# Patient Record
Sex: Male | Born: 1950 | Race: Black or African American | Hispanic: No | Marital: Married | Smoking: Never smoker
Health system: Southern US, Community
[De-identification: ages and names within clinical notes are randomized; demographics above are authoritative.]

## PROBLEM LIST (undated history)

## (undated) DIAGNOSIS — E785 Hyperlipidemia, unspecified: Secondary | ICD-10-CM

## (undated) DIAGNOSIS — I10 Essential (primary) hypertension: Secondary | ICD-10-CM

## (undated) HISTORY — PX: KNEE SURGERY: SHX244

## (undated) HISTORY — PX: CARPAL TUNNEL RELEASE: SHX101

---

## 2009-08-02 ENCOUNTER — Inpatient Hospital Stay (HOSPITAL_COMMUNITY): Admission: RE | Admit: 2009-08-02 | Discharge: 2009-08-04 | Payer: Self-pay | Admitting: Orthopedic Surgery

## 2010-08-08 LAB — BASIC METABOLIC PANEL
BUN: 10 mg/dL (ref 6–23)
Chloride: 96 mEq/L (ref 96–112)
Creatinine, Ser: 1.17 mg/dL (ref 0.4–1.5)
Creatinine, Ser: 1.2 mg/dL (ref 0.4–1.5)
GFR calc Af Amer: >60 mL/min (ref >60)
GFR calc non Af Amer: >60 mL/min (ref >60)
Potassium: 4.2 mEq/L (ref 3.5–5.1)
Potassium: 4.5 mEq/L (ref 3.5–5.1)

## 2010-08-08 LAB — PROTIME-INR
INR: 0.91 (ref 0.00–1.49)
INR: 1.43 (ref 0.00–1.49)

## 2010-08-08 LAB — URINALYSIS, ROUTINE W REFLEX MICROSCOPIC
Bilirubin Urine: NEGATIVE
Hgb urine dipstick: NEGATIVE
Ketones, ur: NEGATIVE mg/dL
Protein, ur: NEGATIVE mg/dL
Specific Gravity, Urine: 1.007 (ref 1.005–1.030)
pH: 6.5 (ref 5.0–8.0)

## 2010-08-08 LAB — CBC
HCT: 31 % — ABNORMAL LOW (ref 39.0–52.0)
HCT: 31.3 % — ABNORMAL LOW (ref 39.0–52.0)
HCT: 44.2 % (ref 39.0–52.0)
Hemoglobin: 10.6 g/dL — ABNORMAL LOW (ref 13.0–17.0)
Hemoglobin: 10.7 g/dL — ABNORMAL LOW (ref 13.0–17.0)
Hemoglobin: 14.4 g/dL (ref 13.0–17.0)
MCV: 83.9 fL (ref 78.0–100.0)
MCV: 84.7 fL (ref 78.0–100.0)
Platelets: 140 10*3/uL — ABNORMAL LOW (ref 150–400)
RBC: 3.7 MIL/uL — ABNORMAL LOW (ref 4.22–5.81)
RBC: 5.22 MIL/uL (ref 4.22–5.81)
RDW: 14.2 % (ref 11.5–15.5)
WBC: 5.1 10*3/uL (ref 4.0–10.5)
WBC: 6.8 10*3/uL (ref 4.0–10.5)

## 2010-08-08 LAB — TYPE AND SCREEN: Antibody Screen: NEGATIVE

## 2010-08-08 LAB — COMPREHENSIVE METABOLIC PANEL
AST: 26 U/L (ref 0–37)
Albumin: 4.9 g/dL (ref 3.5–5.2)
Glucose, Bld: 108 mg/dL — ABNORMAL HIGH (ref 70–99)
Sodium: 135 mEq/L (ref 135–145)
Total Bilirubin: 0.9 mg/dL (ref 0.3–1.2)
Total Protein: 8.4 g/dL — ABNORMAL HIGH (ref 6.0–8.3)

## 2010-08-08 LAB — APTT: aPTT: 28 seconds (ref 24–37)

## 2012-05-02 ENCOUNTER — Observation Stay (HOSPITAL_COMMUNITY)
Admission: EM | Admit: 2012-05-02 | Discharge: 2012-05-03 | Disposition: A | Discharge status: Home / Self Care | Payer: 59 | Attending: Emergency Medicine | Admitting: Emergency Medicine

## 2012-05-02 ENCOUNTER — Emergency Department (HOSPITAL_COMMUNITY): Payer: 59

## 2012-05-02 ENCOUNTER — Encounter (HOSPITAL_COMMUNITY): Payer: Self-pay | Admitting: Emergency Medicine

## 2012-05-02 DIAGNOSIS — E785 Hyperlipidemia, unspecified: Secondary | ICD-10-CM | POA: Insufficient documentation

## 2012-05-02 DIAGNOSIS — R0789 Other chest pain: Principal | ICD-10-CM | POA: Insufficient documentation

## 2012-05-02 DIAGNOSIS — I10 Essential (primary) hypertension: Secondary | ICD-10-CM | POA: Insufficient documentation

## 2012-05-02 HISTORY — DX: Essential (primary) hypertension: I10

## 2012-05-02 HISTORY — DX: Hyperlipidemia, unspecified: E78.5

## 2012-05-02 LAB — BASIC METABOLIC PANEL
BUN: 14 mg/dL (ref 6–23)
CO2: 27 mEq/L (ref 19–32)
Creatinine, Ser: 1.12 mg/dL (ref 0.50–1.35)
GFR calc Af Amer: 80 mL/min — ABNORMAL LOW (ref >90)
GFR calc non Af Amer: 69 mL/min — ABNORMAL LOW (ref >90)

## 2012-05-02 LAB — CBC WITH DIFFERENTIAL/PLATELET
Basophils Absolute: 0 10*3/uL (ref 0.0–0.1)
Basophils Relative: 0 % (ref 0–1)
Eosinophils Absolute: 0.2 10*3/uL (ref 0.0–0.7)
Eosinophils Relative: 3 % (ref 0–5)
HCT: 41.2 % (ref 39.0–52.0)
Hemoglobin: 14.4 g/dL (ref 13.0–17.0)
Lymphocytes Relative: 22 % (ref 12–46)
MCHC: 35 g/dL (ref 30.0–36.0)
Monocytes Absolute: 0.4 10*3/uL (ref 0.1–1.0)
Monocytes Relative: 6 % (ref 3–12)
Neutrophils Relative %: 68 % (ref 43–77)

## 2012-05-02 MED ORDER — SODIUM CHLORIDE 0.9 % IV SOLN
1000.0000 mL | INTRAVENOUS | Status: DC
  Administered 2012-05-03: 1000 mL via INTRAVENOUS

## 2012-05-02 MED ORDER — ASPIRIN 81 MG PO CHEW
324.0000 mg | CHEWABLE_TABLET | Freq: Once | ORAL | Status: AC
  Administered 2012-05-02: 324 mg via ORAL
  Filled 2012-05-02: qty 4

## 2012-05-02 MED ORDER — ASPIRIN 81 MG PO TABS: 325.0000 mg | ORAL_TABLET | Freq: Once | ORAL | Status: DC

## 2012-05-02 NOTE — ED Provider Notes (Signed)
History     CSN: [id number]  Arrival date & time 05/02/12  2201   None     Chief Complaint  Patient presents with  . Chest Pain   HPI chief complaint chest tightness. Patient arrived by EMS. History provided by patient. No language appears identified. Information not limited. Onset of symptoms 2 hours prior to arrival. Location left chest. Symptoms resolved spontaneously. Patient denies pain so quality, radiation not pertinent to this chief complaint. No radiation. Severity mild. Timing brief. Duration 2 hours. Context the patient states that he's been under a tremendous amount of stress lately but was not experiencing a stressful situation when he symptoms started. For associated signs and symptoms please refer to the review of systems. Nto treatments prior to arrival. No recent medical care. Regarding patient's social history please refer to the nurse's notes. Patient denies drug alcohol or energy drink use. No family history for myocardial infarction. I have reviewed patient's past medical, past surgical, social history as well as medications and allergies.  No past medical history on file.  No past surgical history on file.  No family history on file.  History  Substance Use Topics  . Smoking status: Not on file  . Smokeless tobacco: Not on file  . Alcohol Use: Not on file      Review of Systems  Constitutional: Negative for fever and chills.  HENT: Negative for neck pain and neck stiffness.   Eyes: Negative for visual disturbance.  Respiratory: Positive for chest tightness. Negative for cough, shortness of breath, wheezing and stridor.   Cardiovascular: Negative for chest pain, palpitations and leg swelling.  Gastrointestinal: Negative for nausea, vomiting, abdominal pain, diarrhea, constipation, blood in stool and abdominal distention.  Genitourinary: Negative for dysuria, urgency, hematuria and difficulty urinating.  Musculoskeletal: Negative for back pain and gait  problem.  Skin: Negative for rash.  Neurological: Negative for dizziness, tremors, seizures, syncope, facial asymmetry, speech difficulty, weakness, light-headedness, numbness and headaches.  Hematological: Negative for adenopathy. Does not bruise/bleed easily.  Psychiatric/Behavioral: Negative for confusion.    Allergies  Review of patient's allergies indicates not on file.  Home Medications  No current outpatient prescriptions on file.  There were no vitals taken for this visit.  Physical Exam  Constitutional: He is oriented to person, place, and time. He appears well-developed and well-nourished. No distress.  HENT:  Head: Normocephalic.  Eyes: Conjunctivae normal are normal.  Neck: Normal range of motion. Neck supple. No JVD present.  Cardiovascular: Normal rate, regular rhythm, normal heart sounds and intact distal pulses.   No murmur heard. Pulmonary/Chest: Effort normal and breath sounds normal. No respiratory distress. He has no wheezes. He has no rales. He exhibits no tenderness.  Abdominal: Soft. Bowel sounds are normal. He exhibits no distension and no mass. There is no tenderness. There is no rebound and no guarding.  Musculoskeletal: Normal range of motion. He exhibits no edema and no tenderness.  Neurological: He is alert and oriented to person, place, and time.  Skin: Skin is warm and dry. He is not diaphoretic.  Psychiatric: He has a normal mood and affect.    ED Course  Procedures (including critical care time)  Labs Reviewed  BASIC METABOLIC PANEL - Abnormal; Notable for the following:    Sodium 132 (*)     Chloride 95 (*)     Glucose, Bld 125 (*)     GFR calc non Af Amer 69 (*)     GFR calc Af Amer 80 (*)  All other components within normal limits  CBC WITH DIFFERENTIAL  POCT I-STAT TROPONIN I   Dg Chest 2 View  05/02/2012  *RADIOLOGY REPORT*  Clinical Data: Chest pain  CHEST - 2 VIEW  Comparison: 07/27/2009  Findings: Heart size within normal  limits.  Mediastinal contours within normal range.  Lungs predominately clear. No pleural effusion or pneumothorax.    Multilevel degenerative change.  IMPRESSION: No radiographic evidence of acute cardiopulmonary process.   Original Report Authenticated By: Jearld Lesch, M.D.      1. Chest tightness    MDM  Patient is a very well-appearing 61 year old American male with cardiac risk factors including hypertension and hyperlipidemia presenting with 2 hours of left-sided chest tightness. Blood pressure reported 200/100 by EMS but systolic 160 on arrival without treatment. Symptoms resolved before arrival to the ED. The patient complains at this time. Vital signs stable. Afebrile. No acute distress. Clinical picture doubtful for pulmonary embolism aortic dissection pneumonia pneumothorax chest wall trauma esophageal rupture esophageal spasm. Concern for possible ACS. Troponin negative. EKG largely unremarkable. Transferred to CDU under the wrist chest pain observation protocol. Please followup with mid-level providers note regarding testing findings.        Consuello Masse, MD 05/02/12 [phone]

## 2012-05-02 NOTE — ED Notes (Signed)
Per EMS, pt was at work 1 hr ago, felt left CP discomfort, lightheadedness, dizzy, hypertensive 200/100, paroxysmal PVC unifocal, 12 lead unremarkable, pt reported intermittent flutter in chest, HR 112 en route, 2L O2 administered for comfort, pt sats good on RA. Hx HTN, hyperlipidemia, CBG 126. Pt took bp med at 1400 today. A&Ox4, ambulatory, stable.

## 2012-05-03 ENCOUNTER — Observation Stay (HOSPITAL_COMMUNITY): Payer: 59

## 2012-05-03 LAB — TROPONIN I: Troponin I: <0.3 ng/mL (ref <0.30)

## 2012-05-03 MED ORDER — METOPROLOL TARTRATE 25 MG PO TABS
50.0000 mg | ORAL_TABLET | Freq: Once | ORAL | Status: AC
  Administered 2012-05-03: 50 mg via ORAL
  Filled 2012-05-03: qty 2

## 2012-05-03 MED ORDER — METOPROLOL TARTRATE 1 MG/ML IV SOLN
INTRAVENOUS | Status: AC
  Filled 2012-05-03: qty 15

## 2012-05-03 MED ORDER — METOPROLOL TARTRATE 25 MG PO TABS: 100.0000 mg | ORAL_TABLET | Freq: Once | ORAL | Status: DC

## 2012-05-03 MED ORDER — NITROGLYCERIN 0.4 MG SL SUBL
SUBLINGUAL_TABLET | SUBLINGUAL | Status: AC
  Administered 2012-05-03: 10:00:00
  Filled 2012-05-03: qty 25

## 2012-05-03 MED ORDER — IOHEXOL 350 MG/ML SOLN
80.0000 mL | Freq: Once | INTRAVENOUS | Status: AC | PRN
  Administered 2012-05-03: 80 mL via INTRAVENOUS

## 2012-05-03 NOTE — ED Provider Notes (Signed)
I saw and evaluated the patient, reviewed the resident's note and I agree with the findings and plan.  Patient seen by me. The patient with cardiac risk factors of hyperlipidemia and hypertension. With onset of chest discomfort tonight tightness that he hasn't had before started about 2 hours prior to arrival. Patient's initial workup negative however with his risk factors he does require formal cardiac rule out. He is a good candidate for the CDU chest pain protocol patient moved to CDU we'll get CT angiogram protocol and the morning. Patient is currently pain free.  Medical screening examination/treatment/procedure(s) were conducted as a shared visit with non-physician practitioner(s) and myself.  I personally evaluated the patient during the encounter. This will also in the being a shared visit with the CDU mid-level.   Shelda Jakes, MD 05/03/12 [phone]

## 2012-05-03 NOTE — ED Notes (Signed)
Pt d/c'd from both IVs, monitor, continuous pulse oximetry and blood pressure cuff; pt getting dressed to be discharged home

## 2012-05-03 NOTE — ED Notes (Signed)
Pt taken off monitor, continuous pulse oximetry and blood pressure cuff; pt being taken to CT by Lawson Fiscal, RN for his procedure

## 2012-05-03 NOTE — ED Provider Notes (Signed)
Patient in CDU on CPP, for CTA that was found to have Calc score of 96 (82nd percentile) and mild plaque, none greater than 25% in proximal and mid-LAD as resulted by Dr. Llana Aliment. No pain at present. Discussed results with patient and wife. All questions answered.   Discharge home to follow up for continued maintenance of cholesterol, [facility] [place].   Rodena Medin, PA-C 05/03/12 1112

## 2012-05-03 NOTE — ED Notes (Signed)
Spoke with dr entrikin. He has given new orders for pt and wants to be called 40 minutes after given.   [phone]

## 2012-05-03 NOTE — ED Notes (Signed)
Ambulated pt around the nurses' station per RN, pt's heart rate stayed between 81- 84 bpm with 100% oxygenation (RA)

## 2012-05-03 NOTE — ED Notes (Signed)
afterr ambulating HR sustained at 61BPM

## 2012-05-17 NOTE — ED Provider Notes (Signed)
Medical screening examination/treatment/procedure(s) were conducted as a shared visit with non-physician practitioner(s) and myself.  I personally evaluated the patient during the encounter   Shelda Jakes, MD 05/17/12 [phone]

## 2017-05-06 ENCOUNTER — Emergency Department (HOSPITAL_COMMUNITY): Payer: 59

## 2017-05-06 ENCOUNTER — Encounter (HOSPITAL_COMMUNITY): Payer: Self-pay | Admitting: *Deleted

## 2017-05-06 ENCOUNTER — Other Ambulatory Visit: Payer: Self-pay

## 2017-05-06 ENCOUNTER — Observation Stay (HOSPITAL_COMMUNITY)
Admission: EM | Admit: 2017-05-06 | Discharge: 2017-05-07 | Disposition: A | Discharge status: Home / Self Care | Payer: 59 | Attending: Internal Medicine | Admitting: Internal Medicine

## 2017-05-06 DIAGNOSIS — R079 Chest pain, unspecified: Secondary | ICD-10-CM | POA: Diagnosis not present

## 2017-05-06 DIAGNOSIS — Z79899 Other long term (current) drug therapy: Secondary | ICD-10-CM | POA: Diagnosis not present

## 2017-05-06 DIAGNOSIS — R0602 Shortness of breath: Secondary | ICD-10-CM | POA: Diagnosis not present

## 2017-05-06 DIAGNOSIS — I10 Essential (primary) hypertension: Secondary | ICD-10-CM | POA: Insufficient documentation

## 2017-05-06 DIAGNOSIS — R42 Dizziness and giddiness: Secondary | ICD-10-CM | POA: Insufficient documentation

## 2017-05-06 LAB — BASIC METABOLIC PANEL
Anion gap: 10 (ref 5–15)
BUN: 15 mg/dL (ref 6–20)
CHLORIDE: 100 mmol/L — AB (ref 101–111)
CO2: 24 mmol/L (ref 22–32)
Calcium: 9.6 mg/dL (ref 8.9–10.3)
Creatinine, Ser: 1.05 mg/dL (ref 0.61–1.24)
GFR calc Af Amer: >60 mL/min (ref >60)
GFR calc non Af Amer: >60 mL/min (ref >60)
Glucose, Bld: 109 mg/dL — ABNORMAL HIGH (ref 65–99)
POTASSIUM: 3.5 mmol/L (ref 3.5–5.1)
SODIUM: 134 mmol/L — AB (ref 135–145)

## 2017-05-06 LAB — I-STAT TROPONIN, ED: Troponin i, poc: 0 ng/mL (ref 0.00–0.08)

## 2017-05-06 LAB — CBC
HEMATOCRIT: 45.8 % (ref 39.0–52.0)
Hemoglobin: 15.7 g/dL (ref 13.0–17.0)
MCH: 27.9 pg (ref 26.0–34.0)
MCHC: 34.3 g/dL (ref 30.0–36.0)
MCV: 81.5 fL (ref 78.0–100.0)
Platelets: 253 10*3/uL (ref 150–400)
RBC: 5.62 MIL/uL (ref 4.22–5.81)
RDW: 13.1 % (ref 11.5–15.5)
WBC: 5.8 10*3/uL (ref 4.0–10.5)

## 2017-05-06 LAB — TROPONIN I: Troponin I: <0.03 ng/mL (ref <0.03)

## 2017-05-06 MED ORDER — HYDROCHLOROTHIAZIDE 12.5 MG PO CAPS
12.5000 mg | ORAL_CAPSULE | Freq: Every day | ORAL | Status: DC
  Administered 2017-05-06 – 2017-05-07 (×2): 12.5 mg via ORAL
  Filled 2017-05-06 (×2): qty 1

## 2017-05-06 MED ORDER — SIMVASTATIN 20 MG PO TABS
20.0000 mg | ORAL_TABLET | Freq: Every day | ORAL | Status: DC
  Administered 2017-05-06: 20 mg via ORAL
  Filled 2017-05-06 (×2): qty 1

## 2017-05-06 MED ORDER — ONDANSETRON HCL 4 MG/2ML IJ SOLN: 4.0000 mg | Freq: Four times a day (QID) | INTRAMUSCULAR | Status: DC | PRN

## 2017-05-06 MED ORDER — LISINOPRIL 20 MG PO TABS
20.0000 mg | ORAL_TABLET | Freq: Every day | ORAL | Status: DC
  Administered 2017-05-06 – 2017-05-07 (×2): 20 mg via ORAL
  Filled 2017-05-06 (×2): qty 1

## 2017-05-06 MED ORDER — ASPIRIN 81 MG PO CHEW: 81.0000 mg | CHEWABLE_TABLET | Freq: Once | ORAL | Status: DC

## 2017-05-06 MED ORDER — AMLODIPINE BESYLATE 10 MG PO TABS
10.0000 mg | ORAL_TABLET | Freq: Every day | ORAL | Status: DC
  Administered 2017-05-06 – 2017-05-07 (×2): 10 mg via ORAL
  Filled 2017-05-06 (×2): qty 1

## 2017-05-06 MED ORDER — LISINOPRIL-HYDROCHLOROTHIAZIDE 20-12.5 MG PO TABS: 1.0000 | ORAL_TABLET | Freq: Every day | ORAL | Status: DC

## 2017-05-06 MED ORDER — ENOXAPARIN SODIUM 40 MG/0.4ML ~~LOC~~ SOLN
40.0000 mg | SUBCUTANEOUS | Status: DC
  Administered 2017-05-06: 40 mg via SUBCUTANEOUS
  Filled 2017-05-06: qty 0.4

## 2017-05-06 MED ORDER — GI COCKTAIL ~~LOC~~: 30.0000 mL | Freq: Four times a day (QID) | ORAL | Status: DC | PRN

## 2017-05-06 MED ORDER — MORPHINE SULFATE (PF) 4 MG/ML IV SOLN: 2.0000 mg | INTRAVENOUS | Status: DC | PRN

## 2017-05-06 MED ORDER — ACETAMINOPHEN 325 MG PO TABS: 650.0000 mg | ORAL_TABLET | ORAL | Status: DC | PRN

## 2017-05-06 MED ORDER — SODIUM CHLORIDE 0.9 % IV SOLN
Freq: Once | INTRAVENOUS | Status: AC
  Administered 2017-05-06: 23:00:00 via INTRAVENOUS

## 2017-05-06 MED ORDER — ASPIRIN 81 MG PO CHEW
324.0000 mg | CHEWABLE_TABLET | ORAL | Status: AC
  Administered 2017-05-06: 324 mg via ORAL
  Filled 2017-05-06: qty 4

## 2017-05-06 NOTE — ED Provider Notes (Signed)
[facility]   CSN: [id number] Arrival date & time: 05/06/17  1722     History   Chief Complaint Chief Complaint  Patient presents with  . Chest Pain    HPI Garrett Bailey is a 66 y.o. male w/ h/o HTN and HLD presents for evaluation of central chest pressure associated with light-headedness and shortness of breath that began while at rest tonight. Chest pressure non radiating, occurred while at rest and resolved spontaneously.  Reports 3-4 other similar episodes over the last month, episodes last less than 5 mins and resolve w/o intervention. Denies known h/o CAD, diabetes, tobacco abuse, PE/DVT or family h/o CAD at young age. No fevers, chills, cough, abdominal pain, nausea, vomiting, diaphoresis, LE edema, orthopnea. No aggravating or alleviating factors.   HPI  Past Medical History:  Diagnosis Date  . Hyperlipidemia   . Hypertension     There are no active problems to display for this patient.   Past Surgical History:  Procedure Laterality Date  . KNEE SURGERY         Home Medications    Prior to Admission medications   Medication Sig Start Date End Date Taking? Authorizing Provider  amLODipine (NORVASC) 10 MG tablet Take 10 mg by mouth daily after lunch.    Yes [provider]  ARIPiprazole (ABILIFY) 20 MG tablet Take 10 mg by mouth daily as needed (depression).  03/23/17  Yes [provider]  Cholecalciferol (VITAMIN D) 2000 units tablet Take 2,000 Units by mouth daily after lunch.   Yes [provider]  escitalopram (LEXAPRO) 10 MG tablet Take 10 mg by mouth daily as needed (depression).  04/05/17  Yes [provider]  ferrous sulfate 325 (65 FE) MG tablet Take 325 mg by mouth every other day.   Yes [provider]  fluticasone (FLONASE) 50 MCG/ACT nasal spray Place 2 sprays into the nose daily as needed for allergies or rhinitis (nasal congestion).    Yes [provider]  lisinopril-hydrochlorothiazide (PRINZIDE,ZESTORETIC) 20-12.5 MG per tablet Take 1 tablet by mouth daily after lunch.    Yes [provider]  loratadine-pseudoephedrine (CLARITIN-D 24-HOUR) 10-240 MG 24 hr tablet Take 1 tablet by mouth daily as needed for allergies.   Yes [provider]  methocarbamol (ROBAXIN) 500 MG tablet Take 500 mg by mouth 2 (two) times daily as needed for muscle spasms.  03/09/17  Yes [provider]  simvastatin (ZOCOR) 40 MG tablet Take 20 mg by mouth daily after lunch.    Yes [provider]  traZODone (DESYREL) 100 MG tablet Take 100 mg by mouth at bedtime as needed for sleep.  04/05/17  Yes [provider]    Family History History reviewed. No pertinent family history.  Social History Social History   Tobacco Use  . Smoking status: Never Smoker  Substance Use Topics  . Alcohol use: Yes    Frequency: Never    Comment: occ  . Drug use: No     Allergies   Patient has no known allergies.   Review of Systems Review of Systems  Respiratory: Positive for shortness of breath.   Cardiovascular: Positive for chest pain.  Neurological: Positive for light-headedness.  All other systems reviewed and are negative.    Physical Exam Updated Vital Signs BP (!) 138/91   Pulse 75   Temp 97.6 F (36.4 C) (Oral)   Resp 17   Ht 6\' 3"  (1.905 m)  Wt 94.3 kg (208 lb)   SpO2 100%   BMI 26.00 kg/m   Physical Exam  Constitutional: He appears well-developed and well-nourished.  NAD.  HENT:  Head: Normocephalic and atraumatic.  Nose: Nose normal.  Moist mucous membranes. Tonsils and oropharynx normal  Eyes: Conjunctivae, EOM and lids are normal.  Neck: Trachea normal and normal range of motion.  Trachea midline. No cervical adenopathy  Cardiovascular: Normal rate, regular rhythm, S1 normal, S2 normal and normal heart sounds.  Pulses:      Carotid pulses are 2+ on the right side, and 2+ on the  left side.      Radial pulses are 2+ on the right side, and 2+ on the left side.       Dorsalis pedis pulses are 2+ on the right side, and 2+ on the left side.  RRR. No murmurs. No LE edema or calf tenderness. No carotid bruits  Pulmonary/Chest: Effort normal and breath sounds normal. No respiratory distress. He has no decreased breath sounds. He has no wheezes. He has no rhonchi. He has no rales.  No chest wall tenderness.   Abdominal: Soft. Bowel sounds are normal. There is no tenderness.  No epigastric tenderness  Lymphadenopathy:    He has no cervical adenopathy.  Neurological: He is alert. GCS eye subscore is 4. GCS verbal subscore is 5. GCS motor subscore is 6.  Skin: Skin is warm and dry. Capillary refill takes less than 2 seconds.  Psychiatric: He has a normal mood and affect. His speech is normal and behavior is normal. Judgment and thought content normal. Cognition and memory are normal.     ED Treatments / Results  Labs (all labs ordered are listed, but only abnormal results are displayed) Labs Reviewed  BASIC METABOLIC PANEL - Abnormal; Notable for the following components:      Result Value   Sodium 134 (*)    Chloride 100 (*)    Glucose, Bld 109 (*)    All other components within normal limits  CBC  I-STAT TROPONIN, ED    EKG  EKG Interpretation  Date/Time:  Sunday May 06 2017 17:29:06 EST Ventricular Rate:  75 PR Interval:  164 QRS Duration: 96 QT Interval:  382 QTC Calculation: 426 R Axis:   70 Text Interpretation:  Normal sinus rhythm Possible Left atrial enlargement Left ventricular hypertrophy Abnormal ECG No significant change since last tracing Confirmed by Melene PlanFloyd, Dan [phone]([id number]) on 05/06/2017 7:28:27 PM       Radiology Dg Chest 2 View  Result Date: 05/06/2017 CLINICAL DATA:  Chest pain EXAM: CHEST  2 VIEW COMPARISON:  05/02/2012 FINDINGS: Heart and mediastinal contours are within normal limits. No focal opacities or effusions. No acute bony  abnormality. IMPRESSION: No active cardiopulmonary disease. Electronically Signed   By: Charlett NoseKevin  Dover M.D.   On: 05/06/2017 17:52    Procedures Procedures (including critical care time)  Medications Ordered in ED Medications - No data to display  Initial Impression / Assessment and Plan / ED Course  I have reviewed the triage vital signs and the nursing notes.  Pertinent labs & imaging results that were available during my care of the patient were reviewed by me and considered in my medical decision making (see chart for details).    66 yo male w/ h/o HTN and HLD presents with chest pressure, SOB and light-headedness. 3-4 episodes in the last month. No known CAD or previous cardiac work up. Symptom free in ED.  HEART score =  4=5 given age, HLD, HTN, symptomatology.   CP exam today unremarkable. Doubt dissection or PE.   Work up so far reassuring. Initially hypertensive but this has resolved. Discussed recommendation to admit for CP work up given risk factors. Pt and wife upset because they do not want to miss christmas. Requesting time to discuss.  Final Clinical Impressions(s) / ED Diagnoses   2035: Pt and wife agreeable to come into hospital for work up. He is ademant he does not want to miss Christmas at home. Will request admission. Delta trop at 2100.  Final diagnoses:  None    ED Discharge Orders    None       Jerrell Mylar 05/06/17 2037    Melene Plan, DO 05/06/17 2157    Melene Plan, DO 05/08/17 2002

## 2017-05-06 NOTE — H&P (Signed)
History and Physical    Garrett Bailey ZOX:[id number] DOB: 04-Oct-1950 DOA: 05/06/2017  Referring MD/NP/PA: Sharen Heck, PA-C PCP: Default, Provider, MD  Patient coming from: Home  Chief Complaint: Chest pain  I have personally briefly reviewed patient's old medical records in [facility]  HPI: Garrett Bailey is a 66 y.o. male with medical history significant of HTN, HLD, and anxiety/depression; who presents with complaints of intermittent left sternal border chest pressure over the last 3-4 weeks.  Symptoms can last up to approximately 15 minutes and he reports feeling hot all over, diaphoretic, mild shortness of breath ,and dizzy like he could possibly pass out.  Symptoms can occur with standing or sitting and he normally just rests and symptoms resolve on their own.  Denies any recent travel, leg swelling, loss of consciousness, fevers, chills, vomiting, diarrhea, family history of heart disease, orthopnea, or abdominal discomfort.  He reports not being on Abilify or Lexapro and then he will restart these medicines some time soon with his primary care doctor  ED Course: Upon admission into the emergency department patient was seen to be afebrile, pulse 74-87, respirations 17-22, blood pressures 138/91-181/82, O2 saturations 99-100% on room air.  Labs are relatively unremarkable as well including initial troponin of 0.0.  Chest x-ray showed no acute abnormalities.  EKG showed sinus rhythm with no significant ST-T wave changes, but signs of LVH and left atrial abnormalities.  Review of Systems  Constitutional: Negative for chills, fever and weight loss.  HENT: Negative for ear discharge and nosebleeds.   Eyes: Negative for photophobia and pain.  Respiratory: Positive for cough (intermittant) and shortness of breath. Negative for sputum production and wheezing.   Cardiovascular: Positive for chest pain. Negative for orthopnea and leg swelling.  Gastrointestinal: Negative for abdominal  pain, diarrhea, nausea and vomiting.  Genitourinary: Negative for dysuria and hematuria.  Musculoskeletal: Negative for joint pain and myalgias.  Skin: Negative for itching and rash.  Neurological: Negative for sensory change and loss of consciousness.  Psychiatric/Behavioral: Negative for depression and substance abuse.    Past Medical History:  Diagnosis Date  . Hyperlipidemia   . Hypertension     Past Surgical History:  Procedure Laterality Date  . KNEE SURGERY       reports that  has never smoked. He does not have any smokeless tobacco history on file. He reports that he drinks alcohol. He reports that he does not use drugs.  No Known Allergies  History reviewed.  Only history significant for cancer and denies any heart disease.  Prior to Admission medications   Medication Sig Start Date End Date Taking? Authorizing Provider  amLODipine (NORVASC) 10 MG tablet Take 10 mg by mouth daily after lunch.    Yes [provider]  ARIPiprazole (ABILIFY) 20 MG tablet Take 10 mg by mouth daily as needed (depression).  03/23/17  Yes [provider]  Cholecalciferol (VITAMIN D) 2000 units tablet Take 2,000 Units by mouth daily after lunch.   Yes [provider]  escitalopram (LEXAPRO) 10 MG tablet Take 10 mg by mouth daily as needed (depression).  04/05/17  Yes [provider]  ferrous sulfate 325 (65 FE) MG tablet Take 325 mg by mouth every other day.   Yes [provider]  fluticasone (FLONASE) 50 MCG/ACT nasal spray Place 2 sprays into the nose daily as needed for allergies or rhinitis (nasal congestion).    Yes [provider]  lisinopril-hydrochlorothiazide (PRINZIDE,ZESTORETIC) 20-12.5 MG per tablet Take 1  tablet by mouth daily after lunch.    Yes [provider]  loratadine-pseudoephedrine (CLARITIN-D 24-HOUR) 10-240 MG 24 hr tablet Take 1 tablet by mouth daily as needed for allergies.   Yes [provider]    methocarbamol (ROBAXIN) 500 MG tablet Take 500 mg by mouth 2 (two) times daily as needed for muscle spasms.  03/09/17  Yes [provider]  simvastatin (ZOCOR) 40 MG tablet Take 20 mg by mouth daily after lunch.    Yes [provider]  traZODone (DESYREL) 100 MG tablet Take 100 mg by mouth at bedtime as needed for sleep.  04/05/17  Yes [provider]    Physical Exam:  Constitutional: NAD, calm, comfortable Vitals:   05/06/17 1731 05/06/17 1815 05/06/17 1830 05/06/17 1915  BP: (!) 181/82 (!) 145/84 (!) 143/89 (!) 138/91  Pulse: 87 74 74 75  Resp: 18 (!) 22 20 17   Temp: 97.6 F (36.4 C)     TempSrc: Oral     SpO2: 100% 99% 100% 100%  Weight: 94.3 kg (208 lb)     Height: 6\' 3"  (1.905 m)      Eyes: PERRL, lids and conjunctivae normal ENMT: Mucous membranes are moist. Posterior pharynx clear of any exudate or lesions. Normal dentition.  Neck: normal, supple, no masses, no thyromegaly Respiratory: clear to auscultation bilaterally, no wheezing, no crackles. Normal respiratory effort. No accessory muscle use.  Cardiovascular: Regular rate and rhythm, no murmurs / rubs / gallops. No extremity edema. 2+ pedal pulses. No carotid bruits.  Abdomen: no tenderness, no masses palpated. No hepatosplenomegaly. Bowel sounds positive.  Musculoskeletal: no clubbing / cyanosis. No joint deformity upper and lower extremities. Good ROM, no contractures. Normal muscle tone.  Skin: no rashes, lesions, ulcers. No induration Neurologic: CN 2-12 grossly intact. Sensation intact, DTR normal. Strength 5/5 in all 4.  Psychiatric: Normal judgment and insight. Alert and oriented x 3. Normal mood.     Labs on Admission: I have personally reviewed following labs and imaging studies  CBC: Recent Labs  Lab 05/06/17 1757  WBC 5.8  HGB 15.7  HCT 45.8  MCV 81.5  PLT 253   Basic Metabolic Panel: Recent Labs  Lab 05/06/17 1757  NA 134*  K 3.5  CL 100*  CO2 24  GLUCOSE 109*   BUN 15  CREATININE 1.05  CALCIUM 9.6   GFR: Estimated Creatinine Clearance: 82.7 mL/min (by C-G formula based on SCr of 1.05 mg/dL). Liver Function Tests: No results for input(s): AST, ALT, ALKPHOS, BILITOT, PROT, ALBUMIN in the last 168 hours. No results for input(s): LIPASE, AMYLASE in the last 168 hours. No results for input(s): AMMONIA in the last 168 hours. Coagulation Profile: No results for input(s): INR, PROTIME in the last 168 hours. Cardiac Enzymes: No results for input(s): CKTOTAL, CKMB, CKMBINDEX, TROPONINI in the last 168 hours. BNP (last 3 results) No results for input(s): PROBNP in the last 8760 hours. HbA1C: No results for input(s): HGBA1C in the last 72 hours. CBG: No results for input(s): GLUCAP in the last 168 hours. Lipid Profile: No results for input(s): CHOL, HDL, LDLCALC, TRIG, CHOLHDL, LDLDIRECT in the last 72 hours. Thyroid Function Tests: No results for input(s): TSH, T4TOTAL, FREET4, T3FREE, THYROIDAB in the last 72 hours. Anemia Panel: No results for input(s): VITAMINB12, FOLATE, FERRITIN, TIBC, IRON, RETICCTPCT in the last 72 hours. Urine analysis:    Component Value Date/Time   COLORURINE YELLOW 07/27/2009 1328   APPEARANCEUR CLEAR 07/27/2009 1328   LABSPEC 1.007  07/27/2009 1328   PHURINE 6.5 07/27/2009 1328   GLUCOSEU NEGATIVE 07/27/2009 1328   HGBUR NEGATIVE 07/27/2009 1328   BILIRUBINUR NEGATIVE 07/27/2009 1328   KETONESUR NEGATIVE 07/27/2009 1328   PROTEINUR NEGATIVE 07/27/2009 1328   UROBILINOGEN 0.2 07/27/2009 1328   NITRITE NEGATIVE 07/27/2009 1328   LEUKOCYTESUR  07/27/2009 1328    NEGATIVE MICROSCOPIC NOT DONE ON URINES WITH NEGATIVE PROTEIN, BLOOD, LEUKOCYTES, NITRITE, OR GLUCOSE <1000 mg/dL.   Sepsis Labs: No results found for this or any previous visit (from the past 240 hour(s)).   Radiological Exams on Admission: Dg Chest 2 View  Result Date: 05/06/2017 CLINICAL DATA:  Chest pain EXAM: CHEST  2 VIEW COMPARISON:   05/02/2012 FINDINGS: Heart and mediastinal contours are within normal limits. No focal opacities or effusions. No acute bony abnormality. IMPRESSION: No active cardiopulmonary disease. Electronically Signed   By: Charlett NoseKevin  Dover M.D.   On: 05/06/2017 17:52    EKG: Independently reviewed.  Sinus rhythm with signs of left atrial abnormality and LVH.   Assessment/Plan Chest pain: Acute.  Patient presents with complaints of intermittent chest pressure over the last 3 weeks.  Heart score is approximately 4-5.  EKG without any significant signs of ischemia and troponin negative. - Admit to telemetry bed - Trend cardiac troponins - Check echocardiogram in a.m. - Check lipid panel in a.m. - Message sent for cardiology to eval for need of stress testing in a.m.  Essential hypertension:  - Continue amlodipine, lisinopril, hydrochlorothiazide  Possible Anxiety/depression: Patient reports being off Lexapro and Abilify over the last week. - Follow-up in the outpatient PCP  DVT prophylaxis: lovenox Code Status: Full  Family Communication: Discussed plan of care with the patient family present at bedside Disposition Plan: Discharge home if workup negative Consults called: none Admission status: observation  Clydie Braunondell A Tabor Denham MD [facility] Pager [phone]   If 7PM-7AM, please contact night-coverage [email or web address] Password [email or web address]  05/06/2017, 8:46 PM

## 2017-05-06 NOTE — ED Triage Notes (Signed)
Pt reports onset of mid chest pressure and felt lightheaded. Still has the chest pressure with mild sob and nausea.

## 2017-05-06 NOTE — Plan of Care (Signed)
Pt denies any CP. Trops negative thus far. NPO after MD, to be seen by cardiology. Needs echocardiogram. Continuing plan of care as ordered. Will continue to monitor.

## 2017-05-07 ENCOUNTER — Observation Stay (HOSPITAL_BASED_OUTPATIENT_CLINIC_OR_DEPARTMENT_OTHER): Payer: 59

## 2017-05-07 DIAGNOSIS — R0789 Other chest pain: Secondary | ICD-10-CM

## 2017-05-07 DIAGNOSIS — R079 Chest pain, unspecified: Secondary | ICD-10-CM

## 2017-05-07 DIAGNOSIS — I351 Nonrheumatic aortic (valve) insufficiency: Secondary | ICD-10-CM | POA: Diagnosis not present

## 2017-05-07 DIAGNOSIS — E871 Hypo-osmolality and hyponatremia: Secondary | ICD-10-CM

## 2017-05-07 DIAGNOSIS — I340 Nonrheumatic mitral (valve) insufficiency: Secondary | ICD-10-CM | POA: Diagnosis not present

## 2017-05-07 DIAGNOSIS — I10 Essential (primary) hypertension: Secondary | ICD-10-CM

## 2017-05-07 LAB — NM MYOCAR MULTI W/SPECT W/WALL MOTION / EF
CHL CUP MPHR: 154 {beats}/min
CSEPHR: 96 %
Estimated workload: 8.5 METS
Exercise duration (min): 7 min
Exercise duration (sec): 0 s
Peak HR: 148 {beats}/min
RPE: 16
Rest HR: 70 {beats}/min

## 2017-05-07 LAB — BASIC METABOLIC PANEL
ANION GAP: 9 (ref 5–15)
BUN: 13 mg/dL (ref 6–20)
CALCIUM: 9.1 mg/dL (ref 8.9–10.3)
CHLORIDE: 103 mmol/L (ref 101–111)
CO2: 24 mmol/L (ref 22–32)
Creatinine, Ser: 0.99 mg/dL (ref 0.61–1.24)
GFR calc non Af Amer: >60 mL/min (ref >60)
Glucose, Bld: 74 mg/dL (ref 65–99)
POTASSIUM: 3.6 mmol/L (ref 3.5–5.1)
Sodium: 136 mmol/L (ref 135–145)

## 2017-05-07 LAB — CBC WITH DIFFERENTIAL/PLATELET
BASOS ABS: 0 10*3/uL (ref 0.0–0.1)
BASOS PCT: 0 %
Eosinophils Absolute: 0.2 10*3/uL (ref 0.0–0.7)
Eosinophils Relative: 5 %
HEMATOCRIT: 40.7 % (ref 39.0–52.0)
HEMOGLOBIN: 14.1 g/dL (ref 13.0–17.0)
Lymphocytes Relative: 48 %
Lymphs Abs: 2.3 10*3/uL (ref 0.7–4.0)
MCH: 28.1 pg (ref 26.0–34.0)
MCHC: 34.6 g/dL (ref 30.0–36.0)
MCV: 81.2 fL (ref 78.0–100.0)
Monocytes Absolute: 0.4 10*3/uL (ref 0.1–1.0)
Monocytes Relative: 9 %
NEUTROS ABS: 1.8 10*3/uL (ref 1.7–7.7)
NEUTROS PCT: 38 %
Platelets: 215 10*3/uL (ref 150–400)
RBC: 5.01 MIL/uL (ref 4.22–5.81)
RDW: 13.2 % (ref 11.5–15.5)
WBC: 4.7 10*3/uL (ref 4.0–10.5)

## 2017-05-07 LAB — ECHOCARDIOGRAM COMPLETE
Height: 75 in
WEIGHTICAEL: 3190.4 [oz_av]

## 2017-05-07 LAB — LIPID PANEL
Cholesterol: 135 mg/dL (ref 0–200)
HDL: 47 mg/dL (ref >40)
LDL CALC: 76 mg/dL (ref 0–99)
Total CHOL/HDL Ratio: 2.9 RATIO
Triglycerides: 60 mg/dL (ref <150)
VLDL: 12 mg/dL (ref 0–40)

## 2017-05-07 LAB — TROPONIN I
Troponin I: <0.03 ng/mL (ref <0.03)
Troponin I: <0.03 ng/mL (ref <0.03)

## 2017-05-07 MED ORDER — TECHNETIUM TC 99M TETROFOSMIN IV KIT
10.0000 | PACK | Freq: Once | INTRAVENOUS | Status: AC | PRN
  Administered 2017-05-07: 10 via INTRAVENOUS

## 2017-05-07 MED ORDER — TECHNETIUM TC 99M TETROFOSMIN IV KIT
30.0000 | PACK | Freq: Once | INTRAVENOUS | Status: AC | PRN
  Administered 2017-05-07: 30 via INTRAVENOUS

## 2017-05-07 NOTE — Consult Note (Signed)
Cardiology Consultation:   Patient ID: Garrett Bailey; [id number]; 15-Sep-1950   Admit date: 05/06/2017 Date of Consult: 05/07/2017  Primary Care Provider: Default, Provider, MD Primary Cardiologist: New (Dr. Anne Fu)   Patient Profile:   Garrett Bailey is a 66 y.o. male with a hx of mild non osbstructive CAD, based on coronary CTA in 2013, HTN and HLD, who is being seen today for the evaluation of chest pain at the request of Dr. Marland Mcalpine, Internal Medicine.  History of Present Illness:   Garrett Bailey had a coronary CTA 05/03/12 that showed mild nonobstructive CAD. Per report, no coronary artery stenosis in excess of 25% diameter stenosis.  The patient's total coronary calcium score, at that time was 96 which was in the 82nd percentile for patient of matched age, gender and race/ethnicity. He also has a h/o HTN and HLD, treated with medications, which he reports full compliance. He denies any h/o DM nor tobacco use. No family h/o CAD or SCD.   He works at the [employer], Herbalist. Over the past week, he has had 2 episodes of left sternal chest pressure associated with mild diaphoresis. Both episodes occurred while at work, Herbalist. Episodes lasted ~15 min and resolved spontaneously w/o intervention. No exacerbating factors. Not worse with exertion and not triggered by meals. He denies any exertional CP or dyspnea. No syncope/ near syncope.   Given his recent symptoms, he came to the ED for evaluation and was admitted by IM. Cardiac enzymes are negative x 3. EKG shows NSR with LVH. No prior EKGs for comparison. CXR negative. He is currently CP free.   Past Medical History:  Diagnosis Date  . Hyperlipidemia   . Hypertension     Past Surgical History:  Procedure Laterality Date  . KNEE SURGERY       Home Medications:  Prior to Admission medications   Medication Sig Start Date End Date Taking? Authorizing Provider  amLODipine (NORVASC) 10 MG tablet Take 10 mg by mouth daily  after lunch.    Yes [provider]  ARIPiprazole (ABILIFY) 20 MG tablet Take 10 mg by mouth daily as needed (depression).  03/23/17  Yes [provider]  Cholecalciferol (VITAMIN D) 2000 units tablet Take 2,000 Units by mouth daily after lunch.   Yes [provider]  escitalopram (LEXAPRO) 10 MG tablet Take 10 mg by mouth daily as needed (depression).  04/05/17  Yes [provider]  ferrous sulfate 325 (65 FE) MG tablet Take 325 mg by mouth every other day.   Yes [provider]  fluticasone (FLONASE) 50 MCG/ACT nasal spray Place 2 sprays into the nose daily as needed for allergies or rhinitis (nasal congestion).    Yes [provider]  lisinopril-hydrochlorothiazide (PRINZIDE,ZESTORETIC) 20-12.5 MG per tablet Take 1 tablet by mouth daily after lunch.    Yes [provider]  loratadine-pseudoephedrine (CLARITIN-D 24-HOUR) 10-240 MG 24 hr tablet Take 1 tablet by mouth daily as needed for allergies.   Yes [provider]  methocarbamol (ROBAXIN) 500 MG tablet Take 500 mg by mouth 2 (two) times daily as needed for muscle spasms.  03/09/17  Yes [provider]  simvastatin (ZOCOR) 40 MG tablet Take 20 mg by mouth daily after lunch.    Yes [provider]  traZODone (DESYREL) 100 MG tablet Take 100 mg by mouth at bedtime as needed for sleep.  04/05/17  Yes [provider]    Inpatient Medications: Scheduled Meds: . amLODipine  10  mg Oral QPC lunch  . enoxaparin (LOVENOX) injection  40 mg Subcutaneous Q24H  . lisinopril  20 mg Oral Daily   And  . hydrochlorothiazide  12.5 mg Oral Daily  . simvastatin  20 mg Oral QPC lunch   Continuous Infusions:  PRN Meds: acetaminophen, gi cocktail, morphine injection, ondansetron (ZOFRAN) IV  Allergies:   No Known Allergies  Social History:   Social History   Socioeconomic History  . Marital status: Married    Spouse name: Not on file  . Number of  children: Not on file  . Years of education: Not on file  . Highest education level: Not on file  Social Needs  . Financial resource strain: Not on file  . Food insecurity - worry: Not on file  . Food insecurity - inability: Not on file  . Transportation needs - medical: Not on file  . Transportation needs - non-medical: Not on file  Occupational History  . Not on file  Tobacco Use  . Smoking status: Never Smoker  . Smokeless tobacco: Never Used  Substance and Sexual Activity  . Alcohol use: Yes    Frequency: Never    Comment: occ  . Drug use: No  . Sexual activity: Not on file  Other Topics Concern  . Not on file  Social History Narrative  . Not on file    Family History:   History reviewed. No pertinent family history.   ROS:  Please see the history of present illness.  ROS  All other ROS reviewed and negative.     Physical Exam/Data:   Vitals:   05/06/17 2145 05/06/17 2200 05/06/17 2231 05/07/17 0619  BP: 135/81 127/81 (!) 147/84 113/62  Pulse: 87 65 61 (!) 57  Resp: 17 18 18 17   Temp:   98.5 F (36.9 C) 98.4 F (36.9 C)  TempSrc:   Oral Oral  SpO2: 99% 98% 99%   Weight:   201 lb 1.6 oz (91.2 kg) 199 lb 6.4 oz (90.4 kg)  Height:   6\' 3"  (1.905 m)     Intake/Output Summary (Last 24 hours) at 05/07/2017 0751 Last data filed at 05/06/2017 2310 Gross per 24 hour  Intake 120 ml  Output -  Net 120 ml   Filed Weights   05/06/17 1731 05/06/17 2231 05/07/17 0619  Weight: 208 lb (94.3 kg) 201 lb 1.6 oz (91.2 kg) 199 lb 6.4 oz (90.4 kg)   Body mass index is 24.92 kg/m.  General:  Well nourished, well developed, in no acute distress HEENT: normal Lymph: no adenopathy Neck: no JVD Endocrine:  No thryomegaly Vascular: No carotid bruits; FA pulses 2+ bilaterally without bruits  Cardiac:  normal S1, S2; RRR; no murmur  Lungs:  clear to auscultation bilaterally, no wheezing, rhonchi or rales  Abd: soft, nontender, no hepatomegaly  Ext: no  edema Musculoskeletal:  No deformities, BUE and BLE strength normal and equal Skin: warm and dry  Neuro:  CNs 2-12 intact, no focal abnormalities noted Psych:  Normal affect   EKG:  The EKG was personally reviewed and demonstrates:  NSR w/ LVH Telemetry:  Telemetry was personally reviewed and demonstrates:  NSR  Relevant CV Studies: Coronary CTA 04/2012 IMPRESSION: 1.  Mild nonobstructive coronary artery disease, as discussed above.  No coronary artery stenosis in excess of 25% diameter stenosis.  The patient's total coronary calcium score is 96 which is 82nd percentile for patient of matched age, gender and race/ethnicity.  Assessment for potential risk factor modification,  dietary therapy or pharmacologic therapy may be warranted, if clinically indicated. 2.  No acute findings in the visualized thorax to account for patient's symptoms. 3.  Right coronary artery dominance  Laboratory Data:  Chemistry Recent Labs  Lab 05/06/17 1757 05/07/17 0326  NA 134* 136  K 3.5 3.6  CL 100* 103  CO2 24 24  GLUCOSE 109* 74  BUN 15 13  CREATININE 1.05 0.99  CALCIUM 9.6 9.1  GFRNONAA >60 >60  GFRAA >60 >60  ANIONGAP 10 9    No results for input(s): PROT, ALBUMIN, AST, ALT, ALKPHOS, BILITOT in the last 168 hours. Hematology Recent Labs  Lab 05/06/17 1757 05/07/17 0326  WBC 5.8 4.7  RBC 5.62 5.01  HGB 15.7 14.1  HCT 45.8 40.7  MCV 81.5 81.2  MCH 27.9 28.1  MCHC 34.3 34.6  RDW 13.1 13.2  PLT 253 215   Cardiac Enzymes Recent Labs  Lab 05/06/17 2123 05/06/17 2354 05/07/17 0326  TROPONINI <0.03 <0.03 <0.03    Recent Labs  Lab 05/06/17 1755  TROPIPOC 0.00    BNPNo results for input(s): BNP, PROBNP in the last 168 hours.  DDimer No results for input(s): DDIMER in the last 168 hours.  Radiology/Studies:  Dg Chest 2 View  Result Date: 05/06/2017 CLINICAL DATA:  Chest pain EXAM: CHEST  2 VIEW COMPARISON:  05/02/2012 FINDINGS: Heart and mediastinal contours are  within normal limits. No focal opacities or effusions. No acute bony abnormality. IMPRESSION: No active cardiopulmonary disease. Electronically Signed   By: Charlett NoseKevin  Dover M.D.   On: 05/06/2017 17:52    Assessment and Plan:   1.  Chest Pain: 66 y/o AAM with hx of mild non osbstructive CAD, based on coronary CTA in 2013, and treated HTN and HLD (LDL 76 mg/dL). No prior h/o DM, tobacco use or family h/o CAD/ SCD. He presents with mixed typical and atypical chest pain and has ruled out for MI with negative troponins x 3. EKG shows NSR with LVH. No prior EKGs for comparisons. 2D echo pending. CXR unremarkable.  Given his risk factors, recommend noninvasive cardiac testing by exercise nuclear stress study for risk stratification. Continue management of risk factors w/ antihypertensives, statin and daily ASA. Keep NPO until MD sees.   2. HTN: controlled on current regimen.   3. HLD: controlled on statin therapy. LDL 76 mg/dL. Continue simvastatin.    For questions or updates, please contact [facility] Please consult [email or web address] for contact info under Cardiology/STEMI.   Signed, Robbie LisBrittainy Simmons, PA-C  05/07/2017 7:51 AM  Personally seen and examined. Agree with above.  66 year old male with nonobstructive CAD, 25% stenosis previously on CT scan of coronaries in 2013, Paramedicpostal worker, with hypertension hyperlipidemia who had a few episodes of chest discomfort.  Felt overall sense of warmth, mild diaphoresis, left-sided chest discomfort that was intermittent.  Thankfully, troponins have been normal.  EKG demonstrates LVH with repolarization abnormality especially in the precordial leads.  Medications reviewed.  Exam reveals male alert and oriented as well, regular rate and rhythm no murmurs rubs or gallops lungs are clear abdomen soft nontender no edema.  Chest pain -N.p.o. for nuclear stress test.  He may move forward.  Troponins have been normal and reassuring.  EKG as above.  If low risk, may  be discharged.  Essential hypertension -Continue with current therapy.  Hyper lipidemia -Simvastatin.  Excellent LDL.  Donato SchultzMark Skains, MD

## 2017-05-07 NOTE — Progress Notes (Signed)
1 day treadmill nuclear stress test completed without complications [facility].

## 2017-05-07 NOTE — Discharge Summary (Signed)
Physician Discharge Summary  Garrett Bailey WUJ:[id number] DOB: 08/07/1950 DOA: 05/06/2017  PCP: Default, Provider, MD  Admit date: 05/06/2017 Discharge date: 05/07/2017  Admitted From: Home Disposition: Home  Recommendations for Outpatient Follow-up:  1. Follow up with PCP in 1-2 weeks 2. Follow up with Cardiology Dr. Anne FuSkains within 1-2 weeks 3. Please obtain BMP/CBC in one week  Home Health: No Equipment/Devices: None  Discharge Condition: Stable CODE STATUS: FULL CODE Diet recommendation: Heart Healthy Diet   Brief/Interim Summary:  Garrett GleeJohn H Cowsert is a 66 y.o. male with medical history significant of HTN, HLD, and anxiety/depression; who presents with complaints of intermittent left sternal border chest pressure over the last 3-4 weeks.  Symptoms can last up to approximately 15 minutes and he reports feeling hot all over, diaphoretic, mild shortness of breath ,and dizzy like he could possibly pass out.  Symptoms can occur with standing or sitting and he normally just rests and symptoms resolve on their own. Upon admission into the emergency department patient was seen to be afebrile, pulse 74-87, respirations 17-22, blood pressures 138/91-181/82, O2 saturations 99-100% on room air.  Labs are relatively unremarkable as well including initial troponin of 0.0.  Chest x-ray showed no acute abnormalities.  EKG showed sinus rhythm with no significant ST-T wave changes, but signs of LVH and left atrial abnormalities. Cardiology evaluated patient for his CP and checked ECHO and Nuclear Medicine Stress test which were reassuring. Symptoms were felt to be vagal in origin and patient was deemed medically stable to D/C home and follow up with PCP and Cardiology as an outpatient.   Discharge Diagnoses:  Principal Problem:   Chest pain Active Problems:   HTN (hypertension)  Chest Pain -Acute.   -Patient presents with complaints of intermittent chest pressure over the last 3 weeks.   -Heart score  is approximately 4-5.   -EKG without any significant signs of ischemia and troponin negative. -Admitte to telemetry bed -Trend cardiac troponins and were Negative  -ECHOCardiogram as below -Lipid Panel as below -Caridiology consulted and recommended Nuc Stress Test which was reassuring.   Essential Hypertension:  - Continue amlodipine, lisinopril, hydrochlorothiazide at home  Anxiety/Depression:  -Patient reports being off Lexapro and Abilify over the last week. - Follow-up in the outpatient PCP  Hyponatremia -Patient's N+ was 134 and improved to 136 -Repeat as an outpatient   Discharge Instructions  Discharge Instructions    Call MD for:  difficulty breathing, headache or visual disturbances   Complete by:  As directed    Call MD for:  extreme fatigue   Complete by:  As directed    Call MD for:  hives   Complete by:  As directed    Call MD for:  persistant dizziness or light-headedness   Complete by:  As directed    Call MD for:  persistant nausea and vomiting   Complete by:  As directed    Call MD for:  redness, tenderness, or signs of infection (pain, swelling, redness, odor or green/yellow discharge around incision site)   Complete by:  As directed    Call MD for:  severe uncontrolled pain   Complete by:  As directed    Call MD for:  temperature >100.4   Complete by:  As directed    Diet - low sodium heart healthy   Complete by:  As directed    Discharge instructions   Complete by:  As directed    Follow up with PCP for Hospital Follow up and  with Cardiology as needed. Take all medications as prescribed. If symptoms change or worsen please return to the ED for evaluation.   Increase activity slowly   Complete by:  As directed      Allergies as of 05/07/2017   No Known Allergies     Medication List    TAKE these medications   amLODipine 10 MG tablet Commonly known as:  NORVASC Take 10 mg by mouth daily after lunch.   ARIPiprazole 20 MG tablet Commonly  known as:  ABILIFY Take 10 mg by mouth daily as needed (depression).   escitalopram 10 MG tablet Commonly known as:  LEXAPRO Take 10 mg by mouth daily as needed (depression).   ferrous sulfate 325 (65 FE) MG tablet Take 325 mg by mouth every other day.   fluticasone 50 MCG/ACT nasal spray Commonly known as:  FLONASE Place 2 sprays into the nose daily as needed for allergies or rhinitis (nasal congestion).   lisinopril-hydrochlorothiazide 20-12.5 MG tablet Commonly known as:  PRINZIDE,ZESTORETIC Take 1 tablet by mouth daily after lunch.   loratadine-pseudoephedrine 10-240 MG 24 hr tablet Commonly known as:  CLARITIN-D 24-hour Take 1 tablet by mouth daily as needed for allergies.   methocarbamol 500 MG tablet Commonly known as:  ROBAXIN Take 500 mg by mouth 2 (two) times daily as needed for muscle spasms.   simvastatin 40 MG tablet Commonly known as:  ZOCOR Take 20 mg by mouth daily after lunch.   traZODone 100 MG tablet Commonly known as:  DESYREL Take 100 mg by mouth at bedtime as needed for sleep.   Vitamin D 2000 units tablet Take 2,000 Units by mouth daily after lunch.       No Known Allergies  Consultations:  Cardiology Dr. Donato Schultz  Procedures/Studies: Dg Chest 2 View  Result Date: 05/06/2017 CLINICAL DATA:  Chest pain EXAM: CHEST  2 VIEW COMPARISON:  05/02/2012 FINDINGS: Heart and mediastinal contours are within normal limits. No focal opacities or effusions. No acute bony abnormality. IMPRESSION: No active cardiopulmonary disease. Electronically Signed   By: Charlett Nose M.D.   On: 05/06/2017 17:52   Nm Myocar Multi W/spect W/wall Motion / Ef  Result Date: 05/07/2017  Blood pressure demonstrated a hypertensive response to exercise.  There was no ST segment deviation noted during stress.  No T wave inversion was noted during stress.  Defect 1: There is a medium defect of moderate severity present in the basal inferoseptal and mid inferoseptal  location. Possibe artifact.  Findings consistent with possible prior myocardial infarction but there is no evidence of wall motion abnormality. Possible artifact. No ischemia.  Nuclear stress EF: 55%. No wall motion abnormality  This is a low risk study.  Donato Schultz, MD   ECHOCARDIOGRAM ------------------------------------------------------------------- Study Conclusions  - Left ventricle: The cavity size was normal. Systolic function was   normal. The estimated ejection fraction was in the range of 60%   to 65%. Wall motion was normal; there were no regional wall   motion abnormalities. - Aortic valve: There was mild regurgitation. - Mitral valve: There was mild regurgitation. - Right ventricle: The cavity size was mildly dilated. Wall   thickness was normal. - Tricuspid valve: There was trivial regurgitation.  NUCLEAR STRESS TEST  Blood pressure demonstrated a hypertensive response to exercise.  There was no ST segment deviation noted during stress.  No T wave inversion was noted during stress.  Defect 1: There is a medium defect of moderate severity present in the basal  inferoseptal and mid inferoseptal location. Possibe artifact.  Findings consistent with possible prior myocardial infarction but there is no evidence of wall motion abnormality. Possible artifact. No ischemia.  Nuclear stress EF: 55%. No wall motion abnormality  This is a low risk study.   Subjective: Seen and examined at bedside and denied any complaints. No CP or SOB. Ready to go home.  Discharge Exam: Vitals:   05/07/17 1136 05/07/17 1423  BP: (!) 156/79 (!) 144/88  Pulse:  73  Resp:    Temp:  98.1 F (36.7 C)  SpO2:  100%   Vitals:   05/07/17 1132 05/07/17 1134 05/07/17 1136 05/07/17 1423  BP: (!) 212/93 (!) 178/86 (!) 156/79 (!) 144/88  Pulse:    73  Resp:      Temp:    98.1 F (36.7 C)  TempSrc:    Oral  SpO2:    100%  Weight:      Height:       General: Pt is alert, awake, not in  acute distress; Poor Dentition  Cardiovascular: RRR, S1/S2 +, no rubs, no gallops Respiratory: CTA bilaterally, no wheezing, no rhonchi Abdominal: Soft, NT, ND, bowel sounds + Extremities: no edema, no cyanosis  The results of significant diagnostics from this hospitalization (including imaging, microbiology, ancillary and laboratory) are listed below for reference.    Microbiology: No results found for this or any previous visit (from the past 240 hour(s)).   Labs: BNP (last 3 results) No results for input(s): BNP in the last 8760 hours. Basic Metabolic Panel: Recent Labs  Lab 05/06/17 1757 05/07/17 0326  NA 134* 136  K 3.5 3.6  CL 100* 103  CO2 24 24  GLUCOSE 109* 74  BUN 15 13  CREATININE 1.05 0.99  CALCIUM 9.6 9.1   Liver Function Tests: No results for input(s): AST, ALT, ALKPHOS, BILITOT, PROT, ALBUMIN in the last 168 hours. No results for input(s): LIPASE, AMYLASE in the last 168 hours. No results for input(s): AMMONIA in the last 168 hours. CBC: Recent Labs  Lab 05/06/17 1757 05/07/17 0326  WBC 5.8 4.7  NEUTROABS  --  1.8  HGB 15.7 14.1  HCT 45.8 40.7  MCV 81.5 81.2  PLT 253 215   Cardiac Enzymes: Recent Labs  Lab 05/06/17 2123 05/06/17 2354 05/07/17 0326  TROPONINI <0.03 <0.03 <0.03   BNP: Invalid input(s): POCBNP CBG: No results for input(s): GLUCAP in the last 168 hours. D-Dimer No results for input(s): DDIMER in the last 72 hours. Hgb A1c No results for input(s): HGBA1C in the last 72 hours. Lipid Profile Recent Labs    05/07/17 0326  CHOL 135  HDL 47  LDLCALC 76  TRIG 60  CHOLHDL 2.9   Thyroid function studies No results for input(s): TSH, T4TOTAL, T3FREE, THYROIDAB in the last 72 hours.  Invalid input(s): FREET3 Anemia work up No results for input(s): VITAMINB12, FOLATE, FERRITIN, TIBC, IRON, RETICCTPCT in the last 72 hours. Urinalysis    Component Value Date/Time   COLORURINE YELLOW 07/27/2009 1328   APPEARANCEUR CLEAR  07/27/2009 1328   LABSPEC 1.007 07/27/2009 1328   PHURINE 6.5 07/27/2009 1328   GLUCOSEU NEGATIVE 07/27/2009 1328   HGBUR NEGATIVE 07/27/2009 1328   BILIRUBINUR NEGATIVE 07/27/2009 1328   KETONESUR NEGATIVE 07/27/2009 1328   PROTEINUR NEGATIVE 07/27/2009 1328   UROBILINOGEN 0.2 07/27/2009 1328   NITRITE NEGATIVE 07/27/2009 1328   LEUKOCYTESUR  07/27/2009 1328    NEGATIVE MICROSCOPIC NOT DONE ON URINES WITH NEGATIVE PROTEIN, BLOOD, LEUKOCYTES,  NITRITE, OR GLUCOSE <1000 mg/dL.   Sepsis Labs Invalid input(s): PROCALCITONIN,  WBC,  LACTICIDVEN Microbiology No results found for this or any previous visit (from the past 240 hour(s)).  Time coordinating discharge: 35 minutes  SIGNED:  Merlene Laughter, DO [facility] 05/07/2017, 8:52 PM Pager [phone]  If 7PM-7AM, please contact night-coverage [email or web address] Password [email or web address]

## 2017-05-07 NOTE — Progress Notes (Signed)
  Echocardiogram 2D Echocardiogram has been performed.  Celene SkeenVijay  Haydyn Girvan 05/07/2017, 1:43 PM

## 2017-05-07 NOTE — Progress Notes (Signed)
   NUC stress and ECHO reassuring. Perhaps symptoms were vagal in origin. Hot sweaty...   OK to be DC'd.   Follow up with me PRN.   Thanks.  Donato SchultzMark Skains, MD

## 2017-05-07 NOTE — Progress Notes (Signed)
Garrett Bailey to be D/C'd home per MD order.  Discussed with the patient and all questions fully answered.  VSS, Skin clean, dry and intact without evidence of skin break down, no evidence of skin tears noted. IV catheter discontinued intact. Site without signs and symptoms of complications. Dressing and pressure applied.  An After Visit Summary was printed and given to the patient. Patient received prescription.  D/c education completed with patient/family including follow up instructions, medication list, d/c activities limitations if indicated, with other d/c instructions as indicated by MD - patient able to verbalize understanding, all questions fully answered.   Patient instructed to return to ED, call 911, or call MD for any changes in condition.   Patient escorted via WC, and D/C home via private auto.  Garrett Bailey 05/07/2017 4:23 PM

## 2017-09-14 ENCOUNTER — Other Ambulatory Visit: Payer: Self-pay

## 2017-09-14 ENCOUNTER — Encounter (HOSPITAL_COMMUNITY): Payer: Self-pay | Admitting: Emergency Medicine

## 2017-09-14 ENCOUNTER — Emergency Department (HOSPITAL_COMMUNITY): Payer: POS

## 2017-09-14 ENCOUNTER — Emergency Department (HOSPITAL_COMMUNITY)
Admission: EM | Admit: 2017-09-14 | Discharge: 2017-09-14 | Disposition: A | Discharge status: Home / Self Care | Payer: POS | Attending: Emergency Medicine | Admitting: Emergency Medicine

## 2017-09-14 DIAGNOSIS — R42 Dizziness and giddiness: Secondary | ICD-10-CM | POA: Insufficient documentation

## 2017-09-14 DIAGNOSIS — I10 Essential (primary) hypertension: Secondary | ICD-10-CM

## 2017-09-14 DIAGNOSIS — Z79899 Other long term (current) drug therapy: Secondary | ICD-10-CM | POA: Diagnosis not present

## 2017-09-14 LAB — URINALYSIS, ROUTINE W REFLEX MICROSCOPIC
Bilirubin Urine: NEGATIVE
Glucose, UA: NEGATIVE mg/dL
Hgb urine dipstick: NEGATIVE
Ketones, ur: NEGATIVE mg/dL
LEUKOCYTES UA: NEGATIVE
NITRITE: NEGATIVE
PH: 6 (ref 5.0–8.0)
Protein, ur: NEGATIVE mg/dL
SPECIFIC GRAVITY, URINE: 1.004 — AB (ref 1.005–1.030)

## 2017-09-14 LAB — CBC
HEMATOCRIT: 40.6 % (ref 39.0–52.0)
HEMOGLOBIN: 13.7 g/dL (ref 13.0–17.0)
MCH: 27.7 pg (ref 26.0–34.0)
MCHC: 33.7 g/dL (ref 30.0–36.0)
MCV: 82 fL (ref 78.0–100.0)
Platelets: 185 10*3/uL (ref 150–400)
RBC: 4.95 MIL/uL (ref 4.22–5.81)
RDW: 13.4 % (ref 11.5–15.5)
WBC: 5 10*3/uL (ref 4.0–10.5)

## 2017-09-14 LAB — BASIC METABOLIC PANEL
ANION GAP: 7 (ref 5–15)
BUN: 12 mg/dL (ref 6–20)
CO2: 28 mmol/L (ref 22–32)
Calcium: 9.8 mg/dL (ref 8.9–10.3)
Chloride: 98 mmol/L — ABNORMAL LOW (ref 101–111)
Creatinine, Ser: 1.2 mg/dL (ref 0.61–1.24)
GFR calc Af Amer: >60 mL/min (ref >60)
Glucose, Bld: 121 mg/dL — ABNORMAL HIGH (ref 65–99)
POTASSIUM: 4.2 mmol/L (ref 3.5–5.1)
SODIUM: 133 mmol/L — AB (ref 135–145)

## 2017-09-14 LAB — CBG MONITORING, ED: Glucose-Capillary: 103 mg/dL — ABNORMAL HIGH (ref 65–99)

## 2017-09-14 LAB — TSH: TSH: 1.451 u[IU]/mL (ref 0.350–4.500)

## 2017-09-14 LAB — TROPONIN I

## 2017-09-14 MED ORDER — DEXAMETHASONE SODIUM PHOSPHATE 10 MG/ML IJ SOLN: 10.0000 mg | Freq: Once | INTRAMUSCULAR | Status: DC

## 2017-09-14 NOTE — ED Provider Notes (Signed)
[facility] EMERGENCY DEPARTMENT Provider Note   CSN: [id number] Arrival date & time: 09/14/17  4098     History   Chief Complaint No chief complaint on file.   HPI Garrett Bailey is a 67 y.o. male.  Patient is a 67 year old male with a history of hypertension, hyperlipidemia who is presenting today with 1 month of worsening symptoms of blurred/double vision, lightheadedness, general weakness burning that has been intermittent.  Patient states that something similar happened in December and he was admitted and had a cardiac work-up which was within normal limits.  It has again restarted more pronounced in the last month.  The symptoms usually occur while standing and lasts for 15 or 20 minutes and are better after sitting down.  They are not associated with headache, neck pain, chest pain or shortness of breath.  Nothing seems to bring them on and he does not have warning symptoms.  He has never lost consciousness.  He has been taking his blood pressure medication as prescribed for a long period of time and has not had any recent medication changes.  He is not taking any over-the-counter medications that would raise his blood pressure.  He does not use alcohol, tobacco or drugs.  Symptoms occur while at home and at work so does not seem to be environment dependent.  He has been following up with his doctors because he has not been feeling well and has an appointment later today with the [place].  When EMS arrived today because he was not feeling well his blood pressure was 220/120 but on arrival here was normal.  He states his symptoms currently are gone and he feels his normal self.  The history is provided by the patient and the spouse.    Past Medical History:  Diagnosis Date  . Hyperlipidemia   . Hypertension     Patient Active Problem List   Diagnosis Date Noted  . Chest pain 05/06/2017  . HTN (hypertension) 05/06/2017    Past Surgical History:  Procedure  Laterality Date  . KNEE SURGERY          Home Medications    Prior to Admission medications   Medication Sig Start Date End Date Taking? Authorizing Provider  amLODipine (NORVASC) 10 MG tablet Take 10 mg by mouth daily after lunch.     [provider]  ARIPiprazole (ABILIFY) 20 MG tablet Take 10 mg by mouth daily as needed (depression).  03/23/17   [provider]  Cholecalciferol (VITAMIN D) 2000 units tablet Take 2,000 Units by mouth daily after lunch.    [provider]  escitalopram (LEXAPRO) 10 MG tablet Take 10 mg by mouth daily as needed (depression).  04/05/17   [provider]  ferrous sulfate 325 (65 FE) MG tablet Take 325 mg by mouth every other day.    [provider]  fluticasone (FLONASE) 50 MCG/ACT nasal spray Place 2 sprays into the nose daily as needed for allergies or rhinitis (nasal congestion).     [provider]  lisinopril-hydrochlorothiazide (PRINZIDE,ZESTORETIC) 20-12.5 MG per tablet Take 1 tablet by mouth daily after lunch.     [provider]  loratadine-pseudoephedrine (CLARITIN-D 24-HOUR) 10-240 MG 24 hr tablet Take 1 tablet by mouth daily as needed for allergies.    [provider]  methocarbamol (ROBAXIN) 500 MG tablet Take 500 mg by mouth 2 (two) times daily as needed for muscle spasms.  03/09/17   [provider]  simvastatin (ZOCOR)  40 MG tablet Take 20 mg by mouth daily after lunch.     [provider]  traZODone (DESYREL) 100 MG tablet Take 100 mg by mouth at bedtime as needed for sleep.  04/05/17   [provider]    Family History No family history on file.  Social History Social History   Tobacco Use  . Smoking status: Never Smoker  . Smokeless tobacco: Never Used  Substance Use Topics  . Alcohol use: Yes    Frequency: Never    Comment: occ  . Drug use: No     Allergies   Cefdinir   Review of Systems Review of Systems  All other  systems reviewed and are negative.    Physical Exam Updated Vital Signs BP (!) 144/87   Pulse 74   Temp 98.5 F (36.9 C) (Oral)   Resp (!) 25   Ht  (1.905 m)   Wt 91.6 kg (202 lb)   SpO2 98%   BMI 25.25 kg/m   Physical Exam  Constitutional: He is oriented to person, place, and time. He appears well-developed and well-nourished. No distress.  HENT:  Head: Normocephalic and atraumatic.  Mouth/Throat: Oropharynx is clear and moist.  Eyes: Pupils are equal, round, and reactive to light. Conjunctivae and EOM are normal.  Neck: Normal range of motion. Neck supple.  Cardiovascular: Normal rate, regular rhythm and intact distal pulses.  No murmur heard. Pulmonary/Chest: Effort normal and breath sounds normal. No respiratory distress. He has no wheezes. He has no rales.  Abdominal: Soft. He exhibits no distension. There is no tenderness. There is no rebound and no guarding.  Musculoskeletal: Normal range of motion. He exhibits no edema or tenderness.  Neurological: He is alert and oriented to person, place, and time. He has normal strength. No cranial nerve deficit or sensory deficit. Coordination and gait normal.  Normal finger to nose, heel to shin normal bilaterally.  No visual field cuts.  Extraocular movements are intact.  5 out of 5 muscle strength in all extremities.  Sensation is intact.  No pronator drift.  Skin: Skin is warm and dry. No rash noted. No erythema.  Psychiatric: He has a normal mood and affect. His behavior is normal.  Nursing note and vitals reviewed.    ED Treatments / Results  Labs (all labs ordered are listed, but only abnormal results are displayed) Labs Reviewed  BASIC METABOLIC PANEL - Abnormal; Notable for the following components:      Result Value   Sodium 133 (*)    Chloride 98 (*)    Glucose, Bld 121 (*)    All other components within normal limits  URINALYSIS, ROUTINE W REFLEX MICROSCOPIC - Abnormal; Notable for the following components:     Color, Urine COLORLESS (*)    Specific Gravity, Urine 1.004 (*)    All other components within normal limits  CBG MONITORING, ED - Abnormal; Notable for the following components:   Glucose-Capillary 103 (*)    All other components within normal limits  CBC  TROPONIN I  TSH  T4    EKG EKG Interpretation  Date/Time:  Friday Sep 14 2017 06:24:38 EDT Ventricular Rate:  85 PR Interval:    QRS Duration: 90 QT Interval:  363 QTC Calculation: 432 R Axis:   68 Text Interpretation:  Sinus rhythm Consider left ventricular hypertrophy ST elevation, consider anterior injury No significant change since last tracing Confirmed by Ward, Baxter Hire [phone]) on 09/14/2017 6:45:07 AM Also confirmed by Ward,  Baxter Hire [phone]), editor Elita Quick [phone])  on 09/14/2017 7:00:47 AM   Radiology Mr Angiogram Head Wo Contrast  Result Date: 09/14/2017 CLINICAL DATA:  Dizziness and blurred vision.  TIA. EXAM: MRI HEAD WITHOUT CONTRAST MRA HEAD WITHOUT CONTRAST TECHNIQUE: Multiplanar, multiecho pulse sequences of the brain and surrounding structures were obtained without intravenous contrast. Angiographic images of the head were obtained using MRA technique without contrast. COMPARISON:  None. FINDINGS: MRI HEAD FINDINGS Brain: Ventricle size and cerebral volume normal. Negative for acute infarct. Scattered small hyperintensities throughout the white matter bilaterally appear chronic. Brainstem and cerebellum normal. Negative for hemorrhage mass or edema. Vascular: Normal arterial flow void Skull and upper cervical spine: Negative Sinuses/Orbits: Mild mucosal edema paranasal sinuses.  Normal orbit Other: None MRA HEAD FINDINGS Both vertebral arteries widely patent. Basilar widely patent. Left PICA patent, right PICA not patent. AICA, superior cerebellar, and posterior cerebral arteries patent bilaterally. Fetal origin left posterior cerebral artery. Patent right posterior communicating artery. Internal carotid artery  patent bilaterally without stenosis or aneurysm. Anterior and middle cerebral arteries widely patent without stenosis. Negative for vascular malformation IMPRESSION: 1. No acute intracranial abnormality. Mild white matter changes compatible with chronic microvascular ischemia. 2. Negative MRA head Electronically Signed   By: Marlan Palau M.D.   On: 09/14/2017 10:38   Mr Brain Wo Contrast  Result Date: 09/14/2017 CLINICAL DATA:  Dizziness and blurred vision.  TIA. EXAM: MRI HEAD WITHOUT CONTRAST MRA HEAD WITHOUT CONTRAST TECHNIQUE: Multiplanar, multiecho pulse sequences of the brain and surrounding structures were obtained without intravenous contrast. Angiographic images of the head were obtained using MRA technique without contrast. COMPARISON:  None. FINDINGS: MRI HEAD FINDINGS Brain: Ventricle size and cerebral volume normal. Negative for acute infarct. Scattered small hyperintensities throughout the white matter bilaterally appear chronic. Brainstem and cerebellum normal. Negative for hemorrhage mass or edema. Vascular: Normal arterial flow void Skull and upper cervical spine: Negative Sinuses/Orbits: Mild mucosal edema paranasal sinuses.  Normal orbit Other: None MRA HEAD FINDINGS Both vertebral arteries widely patent. Basilar widely patent. Left PICA patent, right PICA not patent. AICA, superior cerebellar, and posterior cerebral arteries patent bilaterally. Fetal origin left posterior cerebral artery. Patent right posterior communicating artery. Internal carotid artery patent bilaterally without stenosis or aneurysm. Anterior and middle cerebral arteries widely patent without stenosis. Negative for vascular malformation IMPRESSION: 1. No acute intracranial abnormality. Mild white matter changes compatible with chronic microvascular ischemia. 2. Negative MRA head Electronically Signed   By: Marlan Palau M.D.   On: 09/14/2017 10:38    Procedures Procedures (including critical care  time)  Medications Ordered in ED Medications - No data to display   Initial Impression / Assessment and Plan / ED Course  I have reviewed the triage vital signs and the nursing notes.  Pertinent labs & imaging results that were available during my care of the patient were reviewed by me and considered in my medical decision making (see chart for details).    Patient is a 67 year old male presenting today with vague intermittent symptoms of blurry vision but he describes that it may be double vision, dizziness and sometimes general weakness.  He denies headache, chest pain or abdominal pain during these events.  These events happen intermittently without any warning signs.  They happen at different times of the day on different days.  They are not related to taking his medications which he takes regularly and has had no new changes.  When EMS arrived today because he was not feeling well  he was hypertensive to 220/120 but his symptoms have resolved and here he is normotensive.  Patient has no focal neurologic deficits at this time.  Patient recently had a full cardiac work-up in December with nuclear med testing and echo which were benign.  Patient has been following with his doctor because for the last month the symptoms have returned he has an appointment at 4 PM today for further evaluation. Patient symptoms do not seem to be cardiac in nature.  Could be TIA or could be hypertensive urgency.  However hypertension seems to be fluctuating so unclear if he has a pheochromocytoma or some other renal issues that could be causing intermittent hypertension.  He is not taking medications that would raise his blood pressure and has not been eating excessive amounts of salt.  Patient has taken his blood pressure medications at 4 PM for years and currently his blood pressure is controlled.  Labs including CBC and BMP without significant changes.  EKG is unchanged.  Troponin and TSH, T4 are pending.  We will do  an MR MRA to ensure no neurologic cause of his symptoms.  10:52 AM Patient's labs and imaging are reassuring.  Feel that it is reasonable for discharge home and [facility]  Encouraged family to keep that follow-up for ongoing evaluation.  Final Clinical Impressions(s) / ED Diagnoses   Final diagnoses:  Dizziness  Hypertension, unspecified type    ED Discharge Orders    None       Gwyneth Sprout, MD 09/14/17 1053

## 2017-09-14 NOTE — ED Notes (Signed)
D/c reviewed with patient 

## 2017-09-14 NOTE — Discharge Instructions (Signed)
Continue to take your blood pressure medications as prescribed until you see your regular doctor.

## 2017-09-14 NOTE — ED Triage Notes (Signed)
Pt arrives from home via [facility] reporting intermittent dizziness over past several days, blurred vision on L last night at 2300 lasting approx 15 minutes.  Pt reports blurred vision on Monday.  Pt reports BLE generalized weakness this am, resolved at this time, states, "I just wasn't feeling right then."  Pt denies HA, n/v, pain. AOx4.

## 2017-09-14 NOTE — ED Notes (Signed)
Pt returned from MRI °

## 2017-09-15 LAB — T4: T4, Total: 8.7 ug/dL (ref 4.5–12.0)

## 2019-10-25 ENCOUNTER — Encounter (HOSPITAL_COMMUNITY): Payer: Self-pay

## 2019-10-25 ENCOUNTER — Emergency Department (HOSPITAL_COMMUNITY): Payer: POS

## 2019-10-25 ENCOUNTER — Emergency Department (HOSPITAL_COMMUNITY)
Admission: EM | Admit: 2019-10-25 | Discharge: 2019-10-25 | Disposition: A | Discharge status: Home / Self Care | Payer: POS | Attending: Emergency Medicine | Admitting: Emergency Medicine

## 2019-10-25 DIAGNOSIS — R42 Dizziness and giddiness: Secondary | ICD-10-CM | POA: Insufficient documentation

## 2019-10-25 DIAGNOSIS — Z79899 Other long term (current) drug therapy: Secondary | ICD-10-CM | POA: Insufficient documentation

## 2019-10-25 DIAGNOSIS — I10 Essential (primary) hypertension: Secondary | ICD-10-CM | POA: Insufficient documentation

## 2019-10-25 LAB — COMPREHENSIVE METABOLIC PANEL
ALT: 22 U/L (ref 0–44)
AST: 22 U/L (ref 15–41)
Albumin: 4.1 g/dL (ref 3.5–5.0)
Alkaline Phosphatase: 60 U/L (ref 38–126)
Anion gap: 12 (ref 5–15)
BUN: 11 mg/dL (ref 8–23)
CO2: 24 mmol/L (ref 22–32)
Calcium: 9.8 mg/dL (ref 8.9–10.3)
Chloride: 102 mmol/L (ref 98–111)
Creatinine, Ser: 1.16 mg/dL (ref 0.61–1.24)
GFR calc Af Amer: >60 mL/min (ref >60)
GFR calc non Af Amer: >60 mL/min (ref >60)
Glucose, Bld: 147 mg/dL — ABNORMAL HIGH (ref 70–99)
Potassium: 3.4 mmol/L — ABNORMAL LOW (ref 3.5–5.1)
Sodium: 138 mmol/L (ref 135–145)
Total Bilirubin: 0.7 mg/dL (ref 0.3–1.2)
Total Protein: 8.1 g/dL (ref 6.5–8.1)

## 2019-10-25 LAB — URINALYSIS, ROUTINE W REFLEX MICROSCOPIC
Bilirubin Urine: NEGATIVE
Glucose, UA: NEGATIVE mg/dL
Hgb urine dipstick: NEGATIVE
Ketones, ur: NEGATIVE mg/dL
Leukocytes,Ua: NEGATIVE
Nitrite: NEGATIVE
Protein, ur: NEGATIVE mg/dL
Specific Gravity, Urine: 1.003 — ABNORMAL LOW (ref 1.005–1.030)
pH: 7 (ref 5.0–8.0)

## 2019-10-25 LAB — CBC WITH DIFFERENTIAL/PLATELET
Abs Immature Granulocytes: 0.01 10*3/uL (ref 0.00–0.07)
Basophils Absolute: 0 10*3/uL (ref 0.0–0.1)
Basophils Relative: 1 %
Eosinophils Absolute: 0.3 10*3/uL (ref 0.0–0.5)
Eosinophils Relative: 6 %
HCT: 43.7 % (ref 39.0–52.0)
Hemoglobin: 14.8 g/dL (ref 13.0–17.0)
Immature Granulocytes: 0 %
Lymphocytes Relative: 40 %
Lymphs Abs: 1.8 10*3/uL (ref 0.7–4.0)
MCH: 28.3 pg (ref 26.0–34.0)
MCHC: 33.9 g/dL (ref 30.0–36.0)
MCV: 83.6 fL (ref 80.0–100.0)
Monocytes Absolute: 0.3 10*3/uL (ref 0.1–1.0)
Monocytes Relative: 7 %
Neutro Abs: 2.1 10*3/uL (ref 1.7–7.7)
Neutrophils Relative %: 46 %
Platelets: 233 10*3/uL (ref 150–400)
RBC: 5.23 MIL/uL (ref 4.22–5.81)
RDW: 12.8 % (ref 11.5–15.5)
WBC: 4.6 10*3/uL (ref 4.0–10.5)
nRBC: 0 % (ref 0.0–0.2)

## 2019-10-25 MED ORDER — MECLIZINE HCL 25 MG PO TABS: 25.0000 mg | ORAL_TABLET | Freq: Three times a day (TID) | ORAL | 0 refills | Status: DC | PRN

## 2019-10-25 MED ORDER — IOHEXOL 350 MG/ML SOLN
75.0000 mL | Freq: Once | INTRAVENOUS | Status: AC | PRN
  Administered 2019-10-25: 75 mL via INTRAVENOUS

## 2019-10-25 NOTE — ED Provider Notes (Signed)
[facility] EMERGENCY DEPARTMENT Provider Note   CSN: [id number] Arrival date & time: 10/25/19  0820     History Chief Complaint  Patient presents with  . Dizziness    Garrett Bailey is a 69 y.o. male with PMH significant for HTN and HLD who presents to the ED via EMS accompanied by his wife with complaints of dizziness.  Patient reports that immediately upon waking this morning at approximately 7:30 AM he was experiencing room spinning dizziness and felt as though his eyes were uncontrollably moving.  Obtained history from EMS reports that he was negative for stroke scale, however they noted nystagmus.  On my examination, patient reports that his symptoms had entirely abated.  He was feeling well and in no acute distress.  I also obtained history from his wife reports that he was complaining of feeling "hot" last night, but no other complaints and has been watching sports on television.  He went to bed at approximately 12 AM.  During his episode that lasted approximately 30 minutes duration, he endorsed spinning dizziness, but denied any numbness weakness, inability to ambulate, ataxia, hearing loss, chest pain or shortness of breath, diaphoresis, nausea or vomiting, incontinence, unilateral extremity swelling, confusion, or other symptoms.  Patient reports that he had bilateral carpal tunnel release performed in the past few weeks by Dr. Eulah Pont, orthopedist.    HPI     Past Medical History:  Diagnosis Date  . Hyperlipidemia   . Hypertension     Patient Active Problem List   Diagnosis Date Noted  . Chest pain 05/06/2017  . HTN (hypertension) 05/06/2017    Past Surgical History:  Procedure Laterality Date  . CARPAL TUNNEL RELEASE Bilateral   . KNEE SURGERY         No family history on file.  Social History   Tobacco Use  . Smoking status: Never Smoker  . Smokeless tobacco: Never Used  Vaping Use  . Vaping Use: Never used  Substance Use Topics  .  Alcohol use: Yes    Comment: occ  . Drug use: No    Home Medications Prior to Admission medications   Medication Sig Start Date End Date Taking? Authorizing Provider  amLODipine (NORVASC) 10 MG tablet Take 10 mg by mouth daily after lunch.     [provider]  ARIPiprazole (ABILIFY) 20 MG tablet Take 10 mg by mouth daily as needed (depression).  03/23/17   [provider]  aspirin EC 81 MG tablet Take 81 mg by mouth daily.    [provider]  Cholecalciferol (VITAMIN D) 2000 units tablet Take 2,000 Units by mouth daily after lunch.    [provider]  escitalopram (LEXAPRO) 10 MG tablet Take 10 mg by mouth daily as needed (depression).  04/05/17   [provider]  ferrous sulfate 325 (65 FE) MG tablet Take 325 mg by mouth every other day.    [provider]  fluticasone (FLONASE) 50 MCG/ACT nasal spray Place 2 sprays into the nose daily as needed for allergies or rhinitis (nasal congestion).     [provider]  lisinopril-hydrochlorothiazide (PRINZIDE,ZESTORETIC) 20-12.5 MG per tablet Take 1 tablet by mouth daily after lunch.     [provider]  loratadine-pseudoephedrine (CLARITIN-D 24-HOUR) 10-240 MG 24 hr tablet Take 1 tablet by mouth daily as needed for allergies.    [provider]  meclizine (ANTIVERT) 25 MG tablet Take 1 tablet (25 mg total) by mouth 3 (three) times daily  as needed for dizziness. 10/25/19   Corena Herter, PA-C  methocarbamol (ROBAXIN) 500 MG tablet Take 500 mg by mouth 2 (two) times daily as needed for muscle spasms.  03/09/17   [provider]  simvastatin (ZOCOR) 40 MG tablet Take 20 mg by mouth daily after lunch.     [provider]  traZODone (DESYREL) 100 MG tablet Take 100 mg by mouth at bedtime as needed for sleep.  04/05/17   [provider]    Allergies    Cefdinir  Review of Systems   Review of Systems  All other systems reviewed and are  negative.   Physical Exam Updated Vital Signs BP (!) 159/98   Pulse 80   Temp 98 F (36.7 C) (Oral)   Resp 20   Ht 6\' 2"  (1.88 m)   Wt 88.9 kg   SpO2 98%   BMI 25.16 kg/m   Physical Exam Vitals and nursing note reviewed. Exam conducted with a chaperone present.  Constitutional:      Appearance: Normal appearance.  HENT:     Head: Normocephalic and atraumatic.     Ears:     Comments: Hearing grossly intact. Eyes:     General: No scleral icterus.    Conjunctiva/sclera: Conjunctivae normal.     Comments: PERRL.  EOM intact.  No nystagmus.  Vision grossly intact (with corrective lenses).  Neck:     Comments: No meningismus.  No obvious carotid bruit. Cardiovascular:     Rate and Rhythm: Normal rate and regular rhythm.     Pulses: Normal pulses.     Heart sounds: Normal heart sounds.  Pulmonary:     Effort: Pulmonary effort is normal. No respiratory distress.     Breath sounds: Normal breath sounds. No wheezing.  Musculoskeletal:     Cervical back: Normal range of motion and neck supple. No rigidity.  Skin:    General: Skin is dry.     Capillary Refill: Capillary refill takes less than 2 seconds.  Neurological:     Mental Status: He is alert and oriented to person, place, and time.     GCS: GCS eye subscore is 4. GCS verbal subscore is 5. GCS motor subscore is 6.     Comments: CN II through XII grossly intact.  Can move all extremities.  Sensation intact throughout.  Ambulates without ataxia.  Negative Romberg and cerebellar exams performed.  Negative Dix-Hallpike testing.    Psychiatric:        Mood and Affect: Mood normal.        Behavior: Behavior normal.        Thought Content: Thought content normal.     ED Results / Procedures / Treatments   Labs (all labs ordered are listed, but only abnormal results are displayed) Labs Reviewed  URINALYSIS, ROUTINE W REFLEX MICROSCOPIC - Abnormal; Notable for the following components:      Result Value   Color, Urine  COLORLESS (*)    Specific Gravity, Urine 1.003 (*)    All other components within normal limits  COMPREHENSIVE METABOLIC PANEL - Abnormal; Notable for the following components:   Potassium 3.4 (*)    Glucose, Bld 147 (*)    All other components within normal limits  CBC WITH DIFFERENTIAL/PLATELET    EKG EKG Interpretation  Date/Time:  Saturday October 25 2019 08:26:50 EDT Ventricular Rate:  90 PR Interval:    QRS Duration: 92 QT Interval:  372 QTC Calculation: 456 R Axis:  64 Text Interpretation: Sinus rhythm Left ventricular hypertrophy No significant change since last tracing Confirmed by Linwood Dibbles [phone]) on 10/25/2019 10:35:44 AM   Radiology CT Angio Head W/Cm &/Or Wo Cm  Result Date: 10/25/2019 CLINICAL DATA:  TIA.  Nystagmus EXAM: CT ANGIOGRAPHY HEAD AND NECK TECHNIQUE: Multidetector CT imaging of the head and neck was performed using the standard protocol during bolus administration of intravenous contrast. Multiplanar CT image reconstructions and MIPs were obtained to evaluate the vascular anatomy. Carotid stenosis measurements (when applicable) are obtained utilizing NASCET criteria, using the distal internal carotid diameter as the denominator. CONTRAST:  17mL OMNIPAQUE IOHEXOL 350 MG/ML SOLN COMPARISON:  Brain MRI and MRA 09/14/2017 FINDINGS: CT HEAD FINDINGS Brain: No evidence of acute infarction, hemorrhage, hydrocephalus, extra-axial collection or mass lesion/mass effect. Vascular: No hyperdense vessel or unexpected calcification. Skull: Normal. Negative for fracture or focal lesion. Sinuses: Imaged portions are clear. Orbits: Negative Review of the MIP images confirms the above findings CTA NECK FINDINGS Aortic arch: No acute finding or dilatation. Three vessel branching. Right carotid system: Calcified plaque at the ICA bulb with no flow limiting stenosis. No ulceration or beading. Left carotid system: Mixed density atheromatous plaque at the proximal common carotid and  proximal ICA without flow limiting stenosis or ulceration. Negative for beading or dissection. Vertebral arteries: Mild proximal subclavian atherosclerosis on the left. Cervical spine spurring intermittently encroaches on the vertebral arteries but no high-grade narrowing or beading. Skeleton: Extensive spondylosis and facet spurring. No acute or aggressive finding Other neck: No acute finding Upper chest: Negative Review of the MIP images confirms the above findings CTA HEAD FINDINGS Anterior circulation: Vessels are smooth and diffusely patent. No noted atheromatous plaque or aneurysm Posterior circulation: Vertebral and basilar arteries are widely patent. No branch occlusion, beading, or aneurysm. Fetal type left PCA. Venous sinuses: Diffusely patent Anatomic variants: As above Review of the MIP images confirms the above findings IMPRESSION: 1. No emergent finding. 2. 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 Electronically Signed   By: Marnee Spring M.D.   On: 10/25/2019 12:17   CT Angio Neck W and/or Wo Contrast  Result Date: 10/25/2019 CLINICAL DATA:  TIA.  Nystagmus EXAM: CT ANGIOGRAPHY HEAD AND NECK TECHNIQUE: Multidetector CT imaging of the head and neck was performed using the standard protocol during bolus administration of intravenous contrast. Multiplanar CT image reconstructions and MIPs were obtained to evaluate the vascular anatomy. Carotid stenosis measurements (when applicable) are obtained utilizing NASCET criteria, using the distal internal carotid diameter as the denominator. CONTRAST:  4mL OMNIPAQUE IOHEXOL 350 MG/ML SOLN COMPARISON:  Brain MRI and MRA 09/14/2017 FINDINGS: CT HEAD FINDINGS Brain: No evidence of acute infarction, hemorrhage, hydrocephalus, extra-axial collection or mass lesion/mass effect. Vascular: No hyperdense vessel or unexpected calcification. Skull: Normal. Negative for fracture or focal lesion. Sinuses:  Imaged portions are clear. Orbits: Negative Review of the MIP images confirms the above findings CTA NECK FINDINGS Aortic arch: No acute finding or dilatation. Three vessel branching. Right carotid system: Calcified plaque at the ICA bulb with no flow limiting stenosis. No ulceration or beading. Left carotid system: Mixed density atheromatous plaque at the proximal common carotid and proximal ICA without flow limiting stenosis or ulceration. Negative for beading or dissection. Vertebral arteries: Mild proximal subclavian atherosclerosis on the left. Cervical spine spurring intermittently encroaches on the vertebral arteries but no high-grade narrowing or beading. Skeleton: Extensive spondylosis and facet spurring. No acute or aggressive finding Other neck: No  acute finding Upper chest: Negative Review of the MIP images confirms the above findings CTA HEAD FINDINGS Anterior circulation: Vessels are smooth and diffusely patent. No noted atheromatous plaque or aneurysm Posterior circulation: Vertebral and basilar arteries are widely patent. No branch occlusion, beading, or aneurysm. Fetal type left PCA. Venous sinuses: Diffusely patent Anatomic variants: As above Review of the MIP images confirms the above findings IMPRESSION: 1. No emergent finding. 2. 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 Electronically Signed   By: Marnee Spring M.D.   On: 10/25/2019 12:17    Procedures Procedures (including critical care time)  Medications Ordered in ED Medications  iohexol (OMNIPAQUE) 350 MG/ML injection 75 mL (75 mLs Intravenous Contrast Given 10/25/19 1126)    ED Course  I have reviewed the triage vital signs and the nursing notes.  Pertinent labs & imaging results that were available during my care of the patient were reviewed by me and considered in my medical decision making (see chart for details).    MDM Rules/Calculators/A&P                           On my examination, patient is neurologically intact.  He is resting comfortably in no acute distress.  He states that he is back to baseline.  His reported dizziness immediately upon waking and before sitting up or getting out of bed is concerning.  There was no obvious trigger or head movement that might suggest a peripheral etiology for his room spinning dizziness.  Negative Dix-Hallpike testing here in the ED.  He also states that his eyes were moving uncontrollably and that this is never happened before.  Patient does have risk factors for CVA/TIA including HTN and HLD.  He also had multiple carpal tunnel release procedures performed in the past few weeks.  He denies any unilateral hearing loss otherwise concerning for a Mnire's disease.  His dizziness has not returned since initial episode.  He states that his symptoms were prior to him standing up so I have a lower suspicion for orthostatic hypotension as the cause.  Will obtain orthostatics here in the ED for further evaluation.  When EMS arrived, his blood pressure was elevated at 180s systolic, so I have low suspicion for a hypotension induced dizziness, however perhaps this was simply symptomatic high blood pressure.  He states that his symptoms abated at time of arrival to the ER and on my examination he was no longer having any nystagmus.  I am unaware as to whether or not his nystagmus was vertical or horizontal which would have otherwise help to delineate between central versus peripheral etiology.    Discussed case with Dr. Lynelle Doctor and we will obtain CT head and neck +/- MRI to evaluate for vascular etiology.  Obtained and reviewed UA, CBC, and CMP which was largely unremarkable.  Patient is mildly hypokalemic to 3.4, will replenish with 20 mEq K-Dur and encourage patient to have laboratory recheck with primary care provider in 2 to 3 days.  His vital signs have remained stable and WNL aside from elevated blood pressure 160s over 90s.  His  wife, CNA, is largely concerned about his blood pressure and recent medication adjustments.  Encouraging them to follow-up with their primary care provider regarding any BP adjustment.  I personally reviewed CTA which demonstrates atherosclerosis in the neck vasculature, however without any flow-limiting stenosis or ulceration.  No acute or emergent findings.  Do not feel as though MRI for further work-up is warranted.  Patient is amatory here in the ED and has been without any symptoms since EMS arrived to his home.  His physical exam is entirely reassuring and there are no focal neurologic deficits.  Encourage patient to follow-up with his primary care provider regarding today's encounter.  Will prescribe patient a course of meclizine in the event that he develops an additional episode of dizziness as an abortive agent.  Dr. Lynelle DoctorKnapp personally evaluated patient and agrees with assessment and plan.  Strict ED return precautions discussed.  All of the evaluation and work-up results were discussed with the patient and any family at bedside. They were provided opportunity to ask any additional questions and have none at this time. They have expressed understanding of verbal discharge instructions as well as return precautions and are agreeable to the plan.    Final Clinical Impression(s) / ED Diagnoses Final diagnoses:  Dizziness    Rx / DC Orders ED Discharge Orders         Ordered    meclizine (ANTIVERT) 25 MG tablet  3 times daily PRN     Discontinue  Reprint     10/25/19 1243           Lorelee NewGreen, Georgene Kopper L, PA-C 10/25/19 1249    Linwood DibblesKnapp, Jon, MD 10/26/19 [phone]

## 2019-10-25 NOTE — Discharge Instructions (Signed)
Please take your prescribed meclizine as needed for symptoms of dizziness.  I would like for you to follow-up with your primary care provider regarding today's encounter.  Return to the ED or seek immediate medical attention should you experience any new or worsening symptoms.

## 2019-10-25 NOTE — ED Triage Notes (Signed)
Pt arrived via [facility] from home for c/o dizziness upon waking at 0730 today, also stated "eyes wouldn't stop moving." Per EMS stroke scale neg. Per EMS pt had some nystagmis. Pt states dizziness has resolved. Pt is A&Ox4. NIH 0. Pt NSR w/ST depression on monitor

## 2019-10-25 NOTE — ED Notes (Signed)
Patient transported to CT 

## 2021-10-06 NOTE — Progress Notes (Signed)
Synopsis: Referred for chronic cough by Alinda Money, MD  Subjective:   PATIENT ID: Garrett Bailey GENDER: male DOB: 06/15/1950, MRN: [id number]  Chief Complaint  Patient presents with   Pulmonary Consult    Referred by Dr. Alinda Money.  Pt c/o cough for the past several years. Cough is non prod and worse after exertion.    71yM with history of HTN, HLD referred for chronic cough  Several years of cough. Mostly dry, occasional mucus production. No sinus congestion currently. No frequent sensation of postnasal drainage. Takes prilosec for reflux which he feels controls his reflux - he doesn't think it helped his cough.  Worse if he exercises.  He has tried albuterol in advance of it but it hasn't helped.   He was on lisinopril 20 mg daily, stopped 2-3 years ago.   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 They started trial of ppi  He had breathing tests last year and was told they were normal.   He is on plaquenil unclear reason. Some kind of inflammatory arthritis - sees Dr. Dierdre Forth. No trouble swallowing, rashes, oral/nasal ulcers, raynaud's phenomenon.   Otherwise pertinent review of systems is negative.  Very limited smoking in past. Retired now, worked for [employer] for about 40 years. Years ago some MJ use but none recent. Was in [place], [place], [place] in past.    Past Medical History:  Diagnosis Date   Hyperlipidemia    Hypertension      Family History  Problem Relation Age of Onset   Emphysema Father        smoked     Past Surgical History:  Procedure Laterality Date   CARPAL TUNNEL RELEASE Bilateral    KNEE SURGERY      Social History   Socioeconomic History   Marital status: Married    Spouse name: Not on file   Number of children: Not on file   Years of education: Not on file   Highest education level: Not on file  Occupational History   Not on file  Tobacco Use   Smoking status: Never   Smokeless tobacco:  Never  Vaping Use   Vaping Use: Never used  Substance and Sexual Activity   Alcohol use: Yes    Comment: occ   Drug use: No   Sexual activity: Not on file  Other Topics Concern   Not on file  Social History Narrative   Not on file   Social Determinants of Health   Financial Resource Strain: Not on file  Food Insecurity: Not on file  Transportation Needs: Not on file  Physical Activity: Not on file  Stress: Not on file  Social Connections: Not on file  Intimate Partner Violence: Not on file     Allergies  Allergen Reactions   Cefdinir Diarrhea     Outpatient Medications Prior to Visit  Medication Sig Dispense Refill   albuterol (VENTOLIN HFA) 108 (90 Base) MCG/ACT inhaler Inhale 1 puff into the lungs every 4 (four) hours as needed.     amLODipine (NORVASC) 10 MG tablet Take 10 mg by mouth daily after lunch.      ARIPiprazole (ABILIFY) 20 MG tablet Take 10 mg by mouth daily as needed (depression).      aspirin EC 81 MG tablet Take 81 mg by mouth daily.     Cholecalciferol (VITAMIN D) 2000 units tablet Take 2,000 Units by mouth daily after lunch.     escitalopram (  LEXAPRO) 10 MG tablet Take 10 mg by mouth daily as needed (depression).      ferrous sulfate 325 (65 FE) MG tablet Take 325 mg by mouth daily with breakfast.     fluticasone (FLONASE) 50 MCG/ACT nasal spray Place 2 sprays into the nose daily as needed for allergies or rhinitis (nasal congestion).      hydrALAZINE (APRESOLINE) 25 MG tablet Take 1 tablet by mouth daily.     hydrochlorothiazide (HYDRODIURIL) 25 MG tablet Take 1 tablet by mouth daily.     hydroxychloroquine (PLAQUENIL) 200 MG tablet Take 1 tablet by mouth daily.     omeprazole (PRILOSEC) 40 MG capsule Take 40 mg by mouth daily.     sildenafil (VIAGRA) 100 MG tablet TAKE ONE TABLET BY MOUTH AS INSTRUCTED (TAKE 1 HOUR PRIOR TO SEXUAL ACTIVITY *DO NOT EXCEED 1 DOSE PER 24 HOUR PERIOD*)     simvastatin (ZOCOR) 40 MG tablet Take 20 mg by mouth daily after  lunch.      traZODone (DESYREL) 100 MG tablet Take 100 mg by mouth at bedtime as needed for sleep.      lisinopril-hydrochlorothiazide (PRINZIDE,ZESTORETIC) 20-12.5 MG per tablet Take 1 tablet by mouth daily after lunch.      loratadine-pseudoephedrine (CLARITIN-D 24-HOUR) 10-240 MG 24 hr tablet Take 1 tablet by mouth daily as needed for allergies.     meclizine (ANTIVERT) 25 MG tablet Take 1 tablet (25 mg total) by mouth 3 (three) times daily as needed for dizziness. 30 tablet 0   methocarbamol (ROBAXIN) 500 MG tablet Take 500 mg by mouth 2 (two) times daily as needed for muscle spasms.      No facility-administered medications prior to visit.       Objective:   Physical Exam:  General appearance: 71 y.o., male, NAD, conversant  Eyes: anicteric sclerae; PERRL, tracking appropriately HENT: NCAT; MMM Neck: Trachea midline; no lymphadenopathy, no JVD Lungs: CTAB, no crackles, no wheeze, with normal respiratory effort CV: RRR, no murmur  Abdomen: Soft, non-tender; non-distended, BS present  Extremities: No peripheral edema, warm Skin: Normal turgor and texture; no rash - ?sclerodactyly, swollen 2nd PIPs L>R Psych: Appropriate affect Neuro: Alert and oriented to person and place, no focal deficit     Vitals:   10/07/21 1011  BP: 134/74  Pulse: 66  Temp: 98.1 F (36.7 C)  TempSrc: Oral  SpO2: 98%  Weight: 207 lb 9.6 oz (94.2 kg)  Height: 6\' 3"  (1.905 m)   98% on RA BMI Readings from Last 3 Encounters:  10/07/21 25.95 kg/m  10/25/19 25.16 kg/m  09/14/17 25.25 kg/m   Wt Readings from Last 3 Encounters:  10/07/21 207 lb 9.6 oz (94.2 kg)  10/25/19 196 lb (88.9 kg)  09/14/17 202 lb (91.6 kg)     CBC    Component Value Date/Time   WBC 4.6 10/25/2019 0850   RBC 5.23 10/25/2019 0850   HGB 14.8 10/25/2019 0850   HCT 43.7 10/25/2019 0850   PLT 233 10/25/2019 0850   MCV 83.6 10/25/2019 0850   MCH 28.3 10/25/2019 0850   MCHC 33.9 10/25/2019 0850   RDW 12.8  10/25/2019 0850   LYMPHSABS 1.8 10/25/2019 0850   MONOABS 0.3 10/25/2019 0850   EOSABS 0.3 10/25/2019 0850   BASOSABS 0.0 10/25/2019 0850    Chest Imaging: CXR 2018 reviewed by me unremarkable  Pulmonary Functions Testing Results:     View : No data to display.          Echocardiogram:  TTE 2018: - Left ventricle: The cavity size was normal. Systolic function was    normal. The estimated ejection fraction was in the range of 60%    to 65%. Wall motion was normal; there were no regional wall    motion abnormalities.  - Aortic valve: There was mild regurgitation.  - Mitral valve: There was mild regurgitation.  - Right ventricle: The cavity size was mildly dilated. Wall    thickness was normal.  - Tricuspid valve: There was trivial regurgitation.       Assessment & Plan:   # Episodic, chronic cough  Wonder if he has exercise induced bronchoconstriction/asthma. Hasn't abated after stopping lisinopril a couple years ago and no response to ppi. No postnasal drainage, lower suspicion for UACS. 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   Plan: - HRCT Chest - methacholine challenge - prn albuterol     Omar PersonNathaniel M Antoine Fiallos, MD [facility] 10/07/2021 10:48 AM

## 2021-10-07 ENCOUNTER — Ambulatory Visit (INDEPENDENT_AMBULATORY_CARE_PROVIDER_SITE_OTHER): Payer: No Typology Code available for payment source | Admitting: Student

## 2021-10-07 ENCOUNTER — Encounter: Payer: Self-pay | Admitting: Student

## 2021-10-07 VITALS — BP 134/74 | HR 66 | Temp 98.1°F | Ht 75.0 in | Wt 207.6 lb

## 2021-10-07 DIAGNOSIS — R053 Chronic cough: Secondary | ICD-10-CM | POA: Diagnosis not present

## 2021-10-07 NOTE — Patient Instructions (Addendum)
-   You will be called to schedule methacholine challenge to look for asthma, CT Chest - See you in 6 weeks, call me or send my chart message if you have any questions about your results before next visit

## 2021-10-14 ENCOUNTER — Ambulatory Visit (INDEPENDENT_AMBULATORY_CARE_PROVIDER_SITE_OTHER)
Admission: RE | Admit: 2021-10-14 | Discharge: 2021-10-14 | Disposition: A | Discharge status: Home / Self Care | Payer: No Typology Code available for payment source | Source: Ambulatory Visit | Attending: Student | Admitting: Student

## 2021-10-14 DIAGNOSIS — R053 Chronic cough: Secondary | ICD-10-CM

## 2021-11-03 ENCOUNTER — Ambulatory Visit (HOSPITAL_COMMUNITY)
Admission: RE | Admit: 2021-11-03 | Discharge: 2021-11-03 | Disposition: A | Discharge status: Home / Self Care | Payer: No Typology Code available for payment source | Source: Ambulatory Visit | Attending: Student | Admitting: Student

## 2021-11-03 DIAGNOSIS — R053 Chronic cough: Secondary | ICD-10-CM | POA: Diagnosis present

## 2021-11-03 LAB — PULMONARY FUNCTION TEST
FEF 25-75 Post: 2.19 L/sec
FEF 25-75 Pre: 2.72 L/sec
FEF2575-%Change-Post: -19 %
FEF2575-%Pred-Post: 73 %
FEF2575-%Pred-Pre: 91 %
FEV1-%Change-Post: -3 %
FEV1-%Pred-Post: 78 %
FEV1-%Pred-Pre: 80 %
FEV1-Post: 3.06 L
FEV1-Pre: 3.15 L
FEV1FVC-%Change-Post: 4 %
FEV1FVC-%Pred-Pre: 103 %
FEV6-%Change-Post: -8 %
FEV6-%Pred-Post: 74 %
FEV6-%Pred-Pre: 81 %
FEV6-Post: 3.77 L
FEV6-Pre: 4.11 L
FEV6FVC-%Change-Post: 0 %
FEV6FVC-%Pred-Post: 103 %
FEV6FVC-%Pred-Pre: 104 %
FVC-%Change-Post: -7 %
FVC-%Pred-Post: 72 %
FVC-%Pred-Pre: 78 %
FVC-Post: 3.83 L
FVC-Pre: 4.15 L
Post FEV1/FVC ratio: 80 %
Post FEV6/FVC ratio: 98 %
Pre FEV1/FVC ratio: 76 %
Pre FEV6/FVC Ratio: 99 %

## 2021-11-03 MED ORDER — SODIUM CHLORIDE 0.9 % IN NEBU
3.0000 mL | INHALATION_SOLUTION | Freq: Once | RESPIRATORY_TRACT | Status: AC
  Administered 2021-11-03: 3 mL via RESPIRATORY_TRACT

## 2021-11-03 MED ORDER — METHACHOLINE 0.0625 MG/ML NEB SOLN
3.0000 mL | Freq: Once | RESPIRATORY_TRACT | Status: AC
  Administered 2021-11-03: 0.5 mg via RESPIRATORY_TRACT

## 2021-11-03 MED ORDER — ALBUTEROL SULFATE (2.5 MG/3ML) 0.083% IN NEBU
2.5000 mg | INHALATION_SOLUTION | Freq: Once | RESPIRATORY_TRACT | Status: AC
  Administered 2021-11-03: 2.5 mg via RESPIRATORY_TRACT

## 2021-11-03 MED ORDER — METHACHOLINE 4 MG/ML NEB SOLN
3.0000 mL | Freq: Once | RESPIRATORY_TRACT | Status: AC
  Administered 2021-11-03: 12 mg via RESPIRATORY_TRACT

## 2021-11-03 MED ORDER — METHACHOLINE 0.25 MG/ML NEB SOLN
3.0000 mL | Freq: Once | RESPIRATORY_TRACT | Status: AC
  Administered 2021-11-03: 2 mg via RESPIRATORY_TRACT

## 2021-11-03 MED ORDER — METHACHOLINE 1 MG/ML NEB SOLN
3.0000 mL | Freq: Once | RESPIRATORY_TRACT | Status: AC
  Administered 2021-11-03: 3 mg via RESPIRATORY_TRACT

## 2021-11-03 MED ORDER — METHACHOLINE 16 MG/ML NEB SOLN
3.0000 mL | Freq: Once | RESPIRATORY_TRACT | Status: AC
  Administered 2021-11-03: 48 mg via RESPIRATORY_TRACT

## 2021-11-18 NOTE — Progress Notes (Unsigned)
Synopsis: Referred for chronic cough by Brooke Bonito, MD  Subjective:   PATIENT ID: Garrett Bailey GENDER: male DOB: August 30, 1950, MRN: [id number]  No chief complaint on file.  70yM with history of HTN, HLD referred for chronic cough  Several years of cough. Mostly dry, occasional mucus production. No sinus congestion currently. No frequent sensation of postnasal drainage. Takes prilosec for reflux which he feels controls his reflux - he doesn't think it helped his cough.  Worse if he exercises.  He has tried albuterol in advance of it but it hasn't helped.   He was on lisinopril 20 mg daily, stopped 2-3 years ago.   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 They started trial of ppi  He had breathing tests last year and was told they were normal.   He is on plaquenil unclear reason. Some kind of inflammatory arthritis - sees Dr. Dierdre Forth. No trouble swallowing, rashes, oral/nasal ulcers, raynaud's phenomenon.   Very limited smoking in past. Retired now, worked for [employer] for about 40 years. Years ago some MJ use but none recent. Was in [place], [place], [place] in past.    Interval HPI: Negative for AHR, unremarkable HRCT chest.  Otherwise pertinent review of systems is negative.  Past Medical History:  Diagnosis Date   Hyperlipidemia    Hypertension      Family History  Problem Relation Age of Onset   Emphysema Father        smoked     Past Surgical History:  Procedure Laterality Date   CARPAL TUNNEL RELEASE Bilateral    KNEE SURGERY      Social History   Socioeconomic History   Marital status: Married    Spouse name: Not on file   Number of children: Not on file   Years of education: Not on file   Highest education level: Not on file  Occupational History   Not on file  Tobacco Use   Smoking status: Never   Smokeless tobacco: Never  Vaping Use   Vaping Use: Never used  Substance and Sexual Activity   Alcohol use: Yes     Comment: occ   Drug use: No   Sexual activity: Not on file  Other Topics Concern   Not on file  Social History Narrative   Not on file   Social Determinants of Health   Financial Resource Strain: Not on file  Food Insecurity: Not on file  Transportation Needs: Not on file  Physical Activity: Not on file  Stress: Not on file  Social Connections: Not on file  Intimate Partner Violence: Not on file     Allergies  Allergen Reactions   Cefdinir Diarrhea     Outpatient Medications Prior to Visit  Medication Sig Dispense Refill   albuterol (VENTOLIN HFA) 108 (90 Base) MCG/ACT inhaler Inhale 1 puff into the lungs every 4 (four) hours as needed.     amLODipine (NORVASC) 10 MG tablet Take 10 mg by mouth daily after lunch.      ARIPiprazole (ABILIFY) 20 MG tablet Take 10 mg by mouth daily as needed (depression).      aspirin EC 81 MG tablet Take 81 mg by mouth daily.     Cholecalciferol (VITAMIN D) 2000 units tablet Take 2,000 Units by mouth daily after lunch.     escitalopram (LEXAPRO) 10 MG tablet Take 10 mg by mouth daily as needed (depression).      ferrous sulfate 325 (65 FE)  MG tablet Take 325 mg by mouth daily with breakfast.     fluticasone (FLONASE) 50 MCG/ACT nasal spray Place 2 sprays into the nose daily as needed for allergies or rhinitis (nasal congestion).      hydrALAZINE (APRESOLINE) 25 MG tablet Take 1 tablet by mouth daily.     hydrochlorothiazide (HYDRODIURIL) 25 MG tablet Take 1 tablet by mouth daily.     hydroxychloroquine (PLAQUENIL) 200 MG tablet Take 1 tablet by mouth daily.     omeprazole (PRILOSEC) 40 MG capsule Take 40 mg by mouth daily.     sildenafil (VIAGRA) 100 MG tablet TAKE ONE TABLET BY MOUTH AS INSTRUCTED (TAKE 1 HOUR PRIOR TO SEXUAL ACTIVITY *DO NOT EXCEED 1 DOSE PER 24 HOUR PERIOD*)     simvastatin (ZOCOR) 40 MG tablet Take 20 mg by mouth daily after lunch.      traZODone (DESYREL) 100 MG tablet Take 100 mg by mouth at bedtime as needed for sleep.       No facility-administered medications prior to visit.       Objective:   Physical Exam:  General appearance: 71 y.o., male, NAD, conversant  Eyes: anicteric sclerae; PERRL, tracking appropriately HENT: NCAT; MMM Neck: Trachea midline; no lymphadenopathy, no JVD Lungs: CTAB, no crackles, no wheeze, with normal respiratory effort CV: RRR, no murmur  Abdomen: Soft, non-tender; non-distended, BS present  Extremities: No peripheral edema, warm Skin: Normal turgor and texture; no rash - ?sclerodactyly, swollen 2nd PIPs L>R Psych: Appropriate affect Neuro: Alert and oriented to person and place, no focal deficit     There were no vitals filed for this visit.    on RA BMI Readings from Last 3 Encounters:  10/07/21 25.95 kg/m  10/25/19 25.16 kg/m  09/14/17 25.25 kg/m   Wt Readings from Last 3 Encounters:  10/07/21 207 lb 9.6 oz (94.2 kg)  10/25/19 196 lb (88.9 kg)  09/14/17 202 lb (91.6 kg)     CBC    Component Value Date/Time   WBC 4.6 10/25/2019 0850   RBC 5.23 10/25/2019 0850   HGB 14.8 10/25/2019 0850   HCT 43.7 10/25/2019 0850   PLT 233 10/25/2019 0850   MCV 83.6 10/25/2019 0850   MCH 28.3 10/25/2019 0850   MCHC 33.9 10/25/2019 0850   RDW 12.8 10/25/2019 0850   LYMPHSABS 1.8 10/25/2019 0850   MONOABS 0.3 10/25/2019 0850   EOSABS 0.3 10/25/2019 0850   BASOSABS 0.0 10/25/2019 0850    Chest Imaging: CXR 2018 reviewed by me unremarkable  HRCT chest 10/14/21 reviewed by me unremarkable  Pulmonary Functions Testing Results:    Latest Ref Rng & Units 11/03/2021    8:32 AM  PFT Results  FVC-Pre L 4.15   FVC-Predicted Pre % 78   FVC-Post L 3.83   FVC-Predicted Post % 72   Pre FEV1/FVC % % 76   Post FEV1/FCV % % 80   FEV1-Pre L 3.15   FEV1-Predicted Pre % 80   FEV1-Post L 3.06    Reviewed by me negative for AHR  Echocardiogram:   TTE 2018: - Left ventricle: The cavity size was normal. Systolic function was    normal. The estimated ejection  fraction was in the range of 60%    to 65%. Wall motion was normal; there were no regional wall    motion abnormalities.  - Aortic valve: There was mild regurgitation.  - Mitral valve: There was mild regurgitation.  - Right ventricle: The cavity size was mildly dilated. Wall  thickness was normal.  - Tricuspid valve: There was trivial regurgitation.       Assessment & Plan:   # Episodic, chronic cough  Wonder if he has exercise induced bronchoconstriction/asthma. Hasn't abated after stopping lisinopril a couple years ago and no response to ppi. No postnasal drainage, lower suspicion for UACS. 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   Plan: - HRCT Chest - methacholine challenge - prn albuterol     Omar Person, MD [facility] 11/18/2021 1:15 PM

## 2021-11-21 ENCOUNTER — Ambulatory Visit (INDEPENDENT_AMBULATORY_CARE_PROVIDER_SITE_OTHER): Payer: No Typology Code available for payment source | Admitting: Student

## 2021-11-21 ENCOUNTER — Encounter: Payer: Self-pay | Admitting: Student

## 2021-11-21 VITALS — BP 132/66 | HR 71 | Temp 98.0°F | Ht 75.0 in | Wt 206.4 lb

## 2021-11-21 DIAGNOSIS — R053 Chronic cough: Secondary | ICD-10-CM

## 2021-11-21 MED ORDER — ASMANEX (60 METERED DOSES) 220 MCG/ACT IN AEPB: 1.0000 | INHALATION_SPRAY | Freq: Two times a day (BID) | RESPIRATORY_TRACT | 2 refills | Status: DC

## 2021-11-21 NOTE — Patient Instructions (Addendum)
-   Take shower, then blow out crusting and then flonase 2 sprays each nostril daily for 2 weeks then 1 spray each nostril daily until next visit - asmanex 1 puff twice daily for 8 weeks in case this represents nonasthmatic eosinophilic bronchitis, use with spacer and rinse mouth and spacer after use - continue albuterol as needed - See you in 8 weeks, call me or send my chart message if you have any questions about your results before next visit

## 2022-02-07 NOTE — Progress Notes (Unsigned)
Synopsis: Referred for chronic cough by Brooke Bonito, MD  Subjective:   PATIENT ID: Garrett Bailey GENDER: male DOB: 10-20-1950, MRN: [id number]  No chief complaint on file.  71yM with history of HTN, HLD referred for chronic cough  Several years of cough. Mostly dry, occasional mucus production. No sinus congestion currently. No frequent sensation of postnasal drainage. Takes prilosec for reflux which he feels controls his reflux - he doesn't think it helped his cough.  Worse if he exercises.  He has tried albuterol in advance of it but it hasn't helped.   He was on lisinopril 20 mg daily, stopped 2-3 years ago.   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 They started trial of ppi  He had breathing tests last year and was told they were normal.   He is on plaquenil unclear reason. Some kind of inflammatory arthritis - sees Dr. Dierdre Forth. No trouble swallowing, rashes, oral/nasal ulcers, raynaud's phenomenon.   Very limited smoking in past. Retired now, worked for post office for about 40 years. Years ago some MJ use but none recent. Was in [place], [place], [place] in past.    Interval HPI: Started on [place], flonase  Otherwise pertinent review of systems is negative.  Past Medical History:  Diagnosis Date   Hyperlipidemia    Hypertension      Family History  Problem Relation Age of Onset   Emphysema Father        smoked     Past Surgical History:  Procedure Laterality Date   CARPAL TUNNEL RELEASE Bilateral    KNEE SURGERY      Social History   Socioeconomic History   Marital status: Married    Spouse name: Not on file   Number of children: Not on file   Years of education: Not on file   Highest education level: Not on file  Occupational History   Not on file  Tobacco Use   Smoking status: Never   Smokeless tobacco: Never  Vaping Use   Vaping Use: Never used  Substance and Sexual Activity   Alcohol use: Yes    Comment: occ    Drug use: No   Sexual activity: Not on file  Other Topics Concern   Not on file  Social History Narrative   Not on file   Social Determinants of Health   Financial Resource Strain: Not on file  Food Insecurity: Not on file  Transportation Needs: Not on file  Physical Activity: Not on file  Stress: Not on file  Social Connections: Not on file  Intimate Partner Violence: Not on file     Allergies  Allergen Reactions   Cefdinir Diarrhea     Outpatient Medications Prior to Visit  Medication Sig Dispense Refill   albuterol (VENTOLIN HFA) 108 (90 Base) MCG/ACT inhaler Inhale 1 puff into the lungs every 4 (four) hours as needed.     amLODipine (NORVASC) 10 MG tablet Take 10 mg by mouth daily after lunch.      aspirin EC 81 MG tablet Take 81 mg by mouth daily.     Cholecalciferol (VITAMIN D) 2000 units tablet Take 2,000 Units by mouth daily after lunch.     ferrous sulfate 325 (65 FE) MG tablet Take 325 mg by mouth daily with breakfast.     fluticasone (FLONASE) 50 MCG/ACT nasal spray Place 2 sprays into the nose daily as needed for allergies or rhinitis (nasal congestion).  hydrALAZINE (APRESOLINE) 25 MG tablet Take 1 tablet by mouth daily.     hydrochlorothiazide (HYDRODIURIL) 25 MG tablet Take 1 tablet by mouth daily.     hydroxychloroquine (PLAQUENIL) 200 MG tablet Take 1 tablet by mouth daily.     losartan (COZAAR) 100 MG tablet Take 1 tablet by mouth daily.     mometasone (ASMANEX, 60 METERED DOSES,) 220 MCG/ACT inhaler Inhale 1 puff into the lungs 2 (two) times daily. 1 each 2   omeprazole (PRILOSEC) 40 MG capsule Take 40 mg by mouth daily.     sildenafil (VIAGRA) 100 MG tablet TAKE ONE TABLET BY MOUTH AS INSTRUCTED (TAKE 1 HOUR PRIOR TO SEXUAL ACTIVITY *DO NOT EXCEED 1 DOSE PER 24 HOUR PERIOD*)     simvastatin (ZOCOR) 40 MG tablet Take 20 mg by mouth daily after lunch.      traZODone (DESYREL) 100 MG tablet Take 100 mg by mouth at bedtime as needed for sleep.      No  facility-administered medications prior to visit.       Objective:   Physical Exam:  General appearance: 71 y.o., male, NAD, conversant  Eyes: anicteric sclerae; PERRL, tracking appropriately HENT: NCAT; MMM Neck: Trachea midline; no lymphadenopathy, no JVD Lungs: CTAB, no crackles, no wheeze, with normal respiratory effort CV: RRR, no murmur  Abdomen: Soft, non-tender; non-distended, BS present  Extremities: No peripheral edema, warm Skin: Normal turgor and texture; no rash - ?sclerodactyly, swollen 2nd PIPs L>R Psych: Appropriate affect Neuro: Alert and oriented to person and place, no focal deficit     There were no vitals filed for this visit.     on RA BMI Readings from Last 3 Encounters:  11/21/21 25.80 kg/m  10/07/21 25.95 kg/m  10/25/19 25.16 kg/m   Wt Readings from Last 3 Encounters:  11/21/21 206 lb 6.4 oz (93.6 kg)  10/07/21 207 lb 9.6 oz (94.2 kg)  10/25/19 196 lb (88.9 kg)     CBC    Component Value Date/Time   WBC 4.6 10/25/2019 0850   RBC 5.23 10/25/2019 0850   HGB 14.8 10/25/2019 0850   HCT 43.7 10/25/2019 0850   PLT 233 10/25/2019 0850   MCV 83.6 10/25/2019 0850   MCH 28.3 10/25/2019 0850   MCHC 33.9 10/25/2019 0850   RDW 12.8 10/25/2019 0850   LYMPHSABS 1.8 10/25/2019 0850   MONOABS 0.3 10/25/2019 0850   EOSABS 0.3 10/25/2019 0850   BASOSABS 0.0 10/25/2019 0850    Chest Imaging: CXR 2018 reviewed by me unremarkable  HRCT chest 10/14/21 reviewed by me unremarkable  Pulmonary Functions Testing Results:    Latest Ref Rng & Units 11/03/2021    8:32 AM  PFT Results  FVC-Pre L 4.15   FVC-Predicted Pre % 78   FVC-Post L 3.83   FVC-Predicted Post % 72   Pre FEV1/FVC % % 76   Post FEV1/FCV % % 80   FEV1-Pre L 3.15   FEV1-Predicted Pre % 80   FEV1-Post L 3.06    Reviewed by me negative for AHR  Echocardiogram:   TTE 2018: - Left ventricle: The cavity size was normal. Systolic function was    normal. The estimated ejection  fraction was in the range of 60%    to 65%. Wall motion was normal; there were no regional wall    motion abnormalities.  - Aortic valve: There was mild regurgitation.  - Mitral valve: There was mild regurgitation.  - Right ventricle: The cavity size was mildly dilated. Wall  thickness was normal.  - Tricuspid valve: There was trivial regurgitation.       Assessment & Plan:   # Episodic, chronic cough  Negative methacholine. Hasn't abated after stopping lisinopril a couple years ago and no response to ppi. Today mentions more of an issue with PND/sinus congestion. 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  Plan: - asmanex 1 puff BID with spacer, instructed re: use, rinse mouth/gargle and spacer afterward - Take shower, then blow out crusting and then flonase 2 sprays each nostril daily for 2 weeks then 1 spray each nostril daily until next visit - albuterol prn   RTC 8 weeks  Omar Person, MD [facility] 02/07/2022 1:59 PM

## 2022-02-08 ENCOUNTER — Encounter: Payer: Self-pay | Admitting: Student

## 2022-02-08 ENCOUNTER — Ambulatory Visit (INDEPENDENT_AMBULATORY_CARE_PROVIDER_SITE_OTHER): Payer: No Typology Code available for payment source | Admitting: Student

## 2022-02-08 VITALS — BP 128/70 | HR 84 | Temp 98.4°F | Ht 75.0 in | Wt 207.8 lb

## 2022-02-08 DIAGNOSIS — R053 Chronic cough: Secondary | ICD-10-CM

## 2022-02-08 NOTE — Patient Instructions (Signed)
- Take shower, then blow out crusting and then flonase 2 sprays each nostril daily for 2 weeks then 1 spray each nostril daily until next visit - asmanex 1 puff twice daily use - continue albuterol as needed - try to stop prilosec, can use pepcid over the counter and see lifestyle measures to reduce GERD below. If your cough worsens after stopping prilosec then you may need to go back on it though. - See you in 3 months or sooner if need be!  Gastroesophageal Reflux Disease, Adult Gastroesophageal reflux (GER) happens when acid from the stomach flows up into the tube that connects the mouth and the stomach (esophagus). Normally, food travels down the esophagus and stays in the stomach to be digested. However, when a person has GER, food and stomach acid sometimes move back up into the esophagus. If this becomes a more serious problem, the person may be diagnosed with a disease called gastroesophageal reflux disease (GERD). GERD occurs when the reflux: Happens often. Causes frequent or severe symptoms. Causes problems such as damage to the esophagus. When stomach acid comes in contact with the esophagus, the acid may cause inflammation in the esophagus. Over time, GERD may create small holes (ulcers) in the lining of the esophagus. What are the causes? This condition is caused by a problem with the muscle between the esophagus and the stomach (lower esophageal sphincter, or LES). Normally, the LES muscle closes after food passes through the esophagus to the stomach. When the LES is weakened or abnormal, it does not close properly, and that allows food and stomach acid to go back up into the esophagus. The LES can be weakened by certain dietary substances, medicines, and medical conditions, including: Tobacco use. Pregnancy. Having a hiatal hernia. Alcohol use. Certain foods and beverages, such as coffee, chocolate, onions, and peppermint. What increases the risk? You are more likely to develop  this condition if you: Have an increased body weight. Have a connective tissue disorder. Take NSAIDs, such as ibuprofen. What are the signs or symptoms? Symptoms of this condition include: Heartburn. Difficult or painful swallowing and the feeling of having a lump in the throat. A bitter taste in the mouth. Bad breath and having a large amount of saliva. Having an upset or bloated stomach and belching. Chest pain. Different conditions can cause chest pain. Make sure you see your health care provider if you experience chest pain. Shortness of breath or wheezing. Ongoing (chronic) cough or a nighttime cough. Wearing away of tooth enamel. Weight loss. How is this diagnosed? This condition may be diagnosed based on a medical history and a physical exam. To determine if you have mild or severe GERD, your health care provider may also monitor how you respond to treatment. You may also have tests, including: A test to examine your stomach and esophagus with a small camera (endoscopy). A test that measures the acidity level in your esophagus. A test that measures how much pressure is on your esophagus. A barium swallow or modified barium swallow test to show the shape, size, and functioning of your esophagus. How is this treated? Treatment for this condition may vary depending on how severe your symptoms are. Your health care provider may recommend: Changes to your diet. Medicine. Surgery. The goal of treatment is to help relieve your symptoms and to prevent complications. Follow these instructions at home: Eating and drinking  Follow a diet as recommended by your health care provider. This may involve avoiding foods and drinks such  as: Coffee and tea, with or without caffeine. Drinks that contain alcohol. Energy drinks and sports drinks. Carbonated drinks or sodas. Chocolate and cocoa. Peppermint and mint flavorings. Garlic and onions. Horseradish. Spicy and acidic foods, including  peppers, chili powder, curry powder, vinegar, hot sauces, and barbecue sauce. Citrus fruit juices and citrus fruits, such as oranges, lemons, and limes. Tomato-based foods, such as red sauce, chili, salsa, and pizza with red sauce. Fried and fatty foods, such as donuts, french fries, potato chips, and high-fat dressings. High-fat meats, such as hot dogs and fatty cuts of red and white meats, such as rib eye steak, sausage, ham, and bacon. High-fat dairy items, such as whole milk, butter, and cream cheese. Eat small, frequent meals instead of large meals. Avoid drinking large amounts of liquid with your meals. Avoid eating meals during the 2-3 hours before bedtime. Avoid lying down right after you eat. Do not exercise right after you eat. Lifestyle  Do not use any products that contain nicotine or tobacco. These products include cigarettes, chewing tobacco, and vaping devices, such as e-cigarettes. If you need help quitting, ask your health care provider. Try to reduce your stress by using methods such as yoga or meditation. If you need help reducing stress, ask your health care provider. If you are overweight, reduce your weight to an amount that is healthy for you. Ask your health care provider for guidance about a safe weight loss goal. General instructions Pay attention to any changes in your symptoms. Take over-the-counter and prescription medicines only as told by your health care provider. Do not take aspirin, ibuprofen, or other NSAIDs unless your health care provider told you to take these medicines. Wear loose-fitting clothing. Do not wear anything tight around your waist that causes pressure on your abdomen. Raise (elevate) the head of your bed about 6 inches (15 cm). You can use a wedge to do this. Avoid bending over if this makes your symptoms worse. Keep all follow-up visits. This is important. Contact a health care provider if: You have: New symptoms. Unexplained weight  loss. Difficulty swallowing or it hurts to swallow. Wheezing or a persistent cough. A hoarse voice. Your symptoms do not improve with treatment. Get help right away if: You have sudden pain in your arms, neck, jaw, teeth, or back. You suddenly feel sweaty, dizzy, or light-headed. You have chest pain or shortness of breath. You vomit and the vomit is green, yellow, or black, or it looks like blood or coffee grounds. You faint. You have stool that is red, bloody, or black. You cannot swallow, drink, or eat. These symptoms may represent a serious problem that is an emergency. Do not wait to see if the symptoms will go away. Get medical help right away. Call your local emergency services (911 in the [place] Do not drive yourself to the hospital. Summary Gastroesophageal reflux happens when acid from the stomach flows up into the esophagus. GERD is a disease in which the reflux happens often, causes frequent or severe symptoms, or causes problems such as damage to the esophagus. Treatment for this condition may vary depending on how severe your symptoms are. Your health care provider may recommend diet and lifestyle changes, medicine, or surgery. Contact a health care provider if you have new or worsening symptoms. Take over-the-counter and prescription medicines only as told by your health care provider. Do not take aspirin, ibuprofen, or other NSAIDs unless your health care provider told you to do so. Keep all follow-up visits  as told by your health care provider. This is important. This information is not intended to replace advice given to you by your health care provider. Make sure you discuss any questions you have with your health care provider. Document Revised: 11/10/2019 Document Reviewed: 11/10/2019 Elsevier Patient Education  [place].

## 2022-04-23 NOTE — Progress Notes (Unsigned)
Synopsis: Referred for chronic cough by Brooke Bonito, MD  Subjective:   PATIENT ID: Garrett Bailey GENDER: male DOB: February 06, 1951, MRN: [id number]  No chief complaint on file.  70yM with history of HTN, HLD referred for chronic cough  Several years of cough. Mostly dry, occasional mucus production. No sinus congestion currently. No frequent sensation of postnasal drainage. Takes prilosec for reflux which he feels controls his reflux - he doesn't think it helped his cough.  Worse if he exercises.  He has tried albuterol in advance of it but it hasn't helped.   He was on lisinopril 20 mg daily, stopped 2-3 years ago.   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 They started trial of ppi  He had breathing tests last year and was told they were normal.   He is on plaquenil unclear reason. Some kind of inflammatory arthritis - sees Dr. Dierdre Forth. No trouble swallowing, rashes, oral/nasal ulcers, raynaud's phenomenon.   Very limited smoking in past. Retired now, worked for post office for about 40 years. Years ago some MJ use but none recent. Was in [place], [place], [place] in past.    Interval HPI: Started on [place], flonase  He says he's doing well now. He thinks the cough is probably a bit better. He is using asmanex 1 puff BID. Rinsing mouth/brushing teeth afterward. He thinks this is probably what's made biggest difference overall.   No reflux/heartburn.   ----------------------------------------------------  Otherwise pertinent review of systems is negative.  Past Medical History:  Diagnosis Date   Hyperlipidemia    Hypertension      Family History  Problem Relation Age of Onset   Emphysema Father        smoked     Past Surgical History:  Procedure Laterality Date   CARPAL TUNNEL RELEASE Bilateral    KNEE SURGERY      Social History   Socioeconomic History   Marital status: Married    Spouse name: Not on file   Number of children:  Not on file   Years of education: Not on file   Highest education level: Not on file  Occupational History   Not on file  Tobacco Use   Smoking status: Never   Smokeless tobacco: Never  Vaping Use   Vaping Use: Never used  Substance and Sexual Activity   Alcohol use: Yes    Comment: occ   Drug use: No   Sexual activity: Not on file  Other Topics Concern   Not on file  Social History Narrative   Not on file   Social Determinants of Health   Financial Resource Strain: Not on file  Food Insecurity: Not on file  Transportation Needs: Not on file  Physical Activity: Not on file  Stress: Not on file  Social Connections: Not on file  Intimate Partner Violence: Not on file     Allergies  Allergen Reactions   Cefdinir Diarrhea     Outpatient Medications Prior to Visit  Medication Sig Dispense Refill   albuterol (VENTOLIN HFA) 108 (90 Base) MCG/ACT inhaler Inhale 1 puff into the lungs every 4 (four) hours as needed.     amLODipine (NORVASC) 10 MG tablet Take 10 mg by mouth daily after lunch.      aspirin EC 81 MG tablet Take 81 mg by mouth daily.     Cholecalciferol (VITAMIN D) 2000 units tablet Take 2,000 Units by mouth daily after lunch.     ferrous  sulfate 325 (65 FE) MG tablet Take 325 mg by mouth daily with breakfast.     fluticasone (FLONASE) 50 MCG/ACT nasal spray Place 2 sprays into the nose daily as needed for allergies or rhinitis (nasal congestion).      hydroxychloroquine (PLAQUENIL) 200 MG tablet Take 1 tablet by mouth daily.     losartan (COZAAR) 100 MG tablet Take 1 tablet by mouth daily.     mometasone (ASMANEX, 60 METERED DOSES,) 220 MCG/ACT inhaler Inhale 1 puff into the lungs 2 (two) times daily. 1 each 2   omeprazole (PRILOSEC) 40 MG capsule Take 40 mg by mouth daily.     sildenafil (VIAGRA) 100 MG tablet TAKE ONE TABLET BY MOUTH AS INSTRUCTED (TAKE 1 HOUR PRIOR TO SEXUAL ACTIVITY *DO NOT EXCEED 1 DOSE PER 24 HOUR PERIOD*)     simvastatin (ZOCOR) 40 MG  tablet Take 20 mg by mouth daily after lunch.      traZODone (DESYREL) 100 MG tablet Take 100 mg by mouth at bedtime as needed for sleep.      No facility-administered medications prior to visit.       Objective:   Physical Exam:  General appearance: 71 y.o., male, NAD, conversant  Eyes: anicteric sclerae; PERRL, tracking appropriately HENT: NCAT; MMM Neck: Trachea midline; no lymphadenopathy, no JVD Lungs: CTAB, no crackles, no wheeze, with normal respiratory effort CV: RRR, no murmur  Abdomen: Soft, non-tender; non-distended, BS present  Extremities: No peripheral edema, warm Skin: Normal turgor and texture; no rash - ?sclerodactyly, swollen 2nd PIPs L>R Psych: Appropriate affect Neuro: Alert and oriented to person and place, no focal deficit     There were no vitals filed for this visit.      on RA BMI Readings from Last 3 Encounters:  02/08/22 25.97 kg/m  11/21/21 25.80 kg/m  10/07/21 25.95 kg/m   Wt Readings from Last 3 Encounters:  02/08/22 207 lb 12.8 oz (94.3 kg)  11/21/21 206 lb 6.4 oz (93.6 kg)  10/07/21 207 lb 9.6 oz (94.2 kg)     CBC    Component Value Date/Time   WBC 4.6 10/25/2019 0850   RBC 5.23 10/25/2019 0850   HGB 14.8 10/25/2019 0850   HCT 43.7 10/25/2019 0850   PLT 233 10/25/2019 0850   MCV 83.6 10/25/2019 0850   MCH 28.3 10/25/2019 0850   MCHC 33.9 10/25/2019 0850   RDW 12.8 10/25/2019 0850   LYMPHSABS 1.8 10/25/2019 0850   MONOABS 0.3 10/25/2019 0850   EOSABS 0.3 10/25/2019 0850   BASOSABS 0.0 10/25/2019 0850    Chest Imaging: CXR 2018 reviewed by me unremarkable  HRCT chest 10/14/21 reviewed by me unremarkable  Pulmonary Functions Testing Results:    Latest Ref Rng & Units 11/03/2021    8:32 AM  PFT Results  FVC-Pre L 4.15   FVC-Predicted Pre % 78   FVC-Post L 3.83   FVC-Predicted Post % 72   Pre FEV1/FVC % % 76   Post FEV1/FCV % % 80   FEV1-Pre L 3.15   FEV1-Predicted Pre % 80   FEV1-Post L 3.06    Reviewed by  me negative for AHR  Echocardiogram:   TTE 2018: - Left ventricle: The cavity size was normal. Systolic function was    normal. The estimated ejection fraction was in the range of 60%    to 65%. Wall motion was normal; there were no regional wall    motion abnormalities.  - Aortic valve: There was mild regurgitation.  -  Mitral valve: There was mild regurgitation.  - Right ventricle: The cavity size was mildly dilated. Wall    thickness was normal.  - Tricuspid valve: There was trivial regurgitation.       Assessment & Plan:   # Episodic, chronic cough  Negative methacholine. Hasn't abated after stopping lisinopril a couple years ago and no response to ppi. Today mentions more of an issue with PND/sinus congestion. 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  Plan: - Take shower, then blow out crusting and then flonase 2 sprays each nostril daily for 2 weeks then 1 spray each nostril daily until next visit - asmanex 1 puff twice daily use - continue albuterol as needed - can try to stop prilosec, can use pepcid over the counter and discussed lifestyle measures to reduce GERD below. If your cough worsens after stopping prilosec then you may need to go back on it though. - See you in 3 months or sooner if need be!   RTC 8 weeks  Omar Person, MD [facility] 04/23/2022 4:17 PM

## 2022-04-25 ENCOUNTER — Encounter: Payer: Self-pay | Admitting: Student

## 2022-04-25 ENCOUNTER — Ambulatory Visit (INDEPENDENT_AMBULATORY_CARE_PROVIDER_SITE_OTHER): Payer: No Typology Code available for payment source | Admitting: Student

## 2022-04-25 VITALS — BP 160/80 | HR 86 | Temp 98.0°F | Ht 75.0 in | Wt 211.2 lb

## 2022-04-25 DIAGNOSIS — R053 Chronic cough: Secondary | ICD-10-CM

## 2022-04-25 NOTE — Patient Instructions (Signed)
-   Take shower, then blow out crusting and then flonase 2 sprays each nostril daily for 2 weeks then 1 spray each nostril daily  - asmanex 1 puff once to twice daily use - rinse mouth after use - continue albuterol as needed - OK to exercise - I encourage you to exercise  - See you in 3 months or sooner if need be!

## 2022-07-23 NOTE — Progress Notes (Unsigned)
Synopsis: Referred for chronic cough by Reita Cliche, MD  Subjective:   PATIENT ID: Garrett Bailey GENDER: male DOB: 1950-12-12, MRN: [id number]  No chief complaint on file.  69yM with history of HTN, HLD referred for chronic cough  Several years of cough. Mostly dry, occasional mucus production. No sinus congestion currently. No frequent sensation of postnasal drainage. Takes prilosec for reflux which he feels controls his reflux - he doesn't think it helped his cough.  Worse if he exercises.  He has tried albuterol in advance of it but it hasn't helped.   He was on lisinopril 20 mg daily, stopped 2-3 years ago.   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 They started trial of ppi  He had breathing tests last year and was told they were normal.   He is on plaquenil unclear reason. Some kind of inflammatory arthritis - sees Dr. Amil Amen. No trouble swallowing, rashes, oral/nasal ulcers, raynaud's phenomenon.   Very limited smoking in past. Retired now, worked for post office for about 40 years. Years ago some MJ use but none recent. Was in [place], [place], [place] in past.    Interval HPI: Started on [place], flonase  He says he's doing well now. He thinks the cough is probably a bit better. He is using asmanex 1 puff BID. Rinsing mouth/brushing teeth afterward. He thinks this is probably what's made biggest difference overall.   No reflux/heartburn.   ----------------------------------------------------  Cough and runny nose after a cruise to [place]. Tried mucinex and delsym. Finally got better. Still using asmanex 1 puff once daily. Rinsing mouth after use. Still using flonase as well.  ---------------------------- De-escalated GERD treatment last visit   Otherwise pertinent review of systems is negative.  Past Medical History:  Diagnosis Date   Hyperlipidemia    Hypertension      Family History  Problem Relation Age of Onset    Emphysema Father        smoked     Past Surgical History:  Procedure Laterality Date   CARPAL TUNNEL RELEASE Bilateral    KNEE SURGERY      Social History   Socioeconomic History   Marital status: Married    Spouse name: Not on file   Number of children: Not on file   Years of education: Not on file   Highest education level: Not on file  Occupational History   Not on file  Tobacco Use   Smoking status: Never   Smokeless tobacco: Never  Vaping Use   Vaping Use: Never used  Substance and Sexual Activity   Alcohol use: Yes    Comment: occ   Drug use: No   Sexual activity: Not on file  Other Topics Concern   Not on file  Social History Narrative   Not on file   Social Determinants of Health   Financial Resource Strain: Not on file  Food Insecurity: Not on file  Transportation Needs: Not on file  Physical Activity: Not on file  Stress: Not on file  Social Connections: Not on file  Intimate Partner Violence: Not on file     Allergies  Allergen Reactions   Cefdinir Diarrhea     Outpatient Medications Prior to Visit  Medication Sig Dispense Refill   albuterol (VENTOLIN HFA) 108 (90 Base) MCG/ACT inhaler Inhale 1 puff into the lungs every 4 (four) hours as needed.     amLODipine (NORVASC) 10 MG tablet Take 10 mg by mouth daily  after lunch.      aspirin EC 81 MG tablet Take 81 mg by mouth daily.     Cholecalciferol (VITAMIN D) 2000 units tablet Take 2,000 Units by mouth daily after lunch.     ferrous sulfate 325 (65 FE) MG tablet Take 325 mg by mouth daily with breakfast.     fluticasone (FLONASE) 50 MCG/ACT nasal spray Place 2 sprays into the nose daily as needed for allergies or rhinitis (nasal congestion).      hydroxychloroquine (PLAQUENIL) 200 MG tablet Take 1 tablet by mouth daily.     losartan (COZAAR) 100 MG tablet Take 1 tablet by mouth daily.     mometasone (ASMANEX, 60 METERED DOSES,) 220 MCG/ACT inhaler Inhale 1 puff into the lungs 2 (two) times daily.  1 each 2   omeprazole (PRILOSEC) 40 MG capsule Take 40 mg by mouth daily.     sildenafil (VIAGRA) 100 MG tablet TAKE ONE TABLET BY MOUTH AS INSTRUCTED (TAKE 1 HOUR PRIOR TO SEXUAL ACTIVITY *DO NOT EXCEED 1 DOSE PER 24 HOUR PERIOD*)     simvastatin (ZOCOR) 40 MG tablet Take 20 mg by mouth daily after lunch.      traZODone (DESYREL) 100 MG tablet Take 100 mg by mouth at bedtime as needed for sleep.      No facility-administered medications prior to visit.       Objective:   Physical Exam:  General appearance: 72 y.o., male, NAD, conversant  Eyes: anicteric sclerae; PERRL, tracking appropriately HENT: NCAT; MMM Neck: Trachea midline; no lymphadenopathy, no JVD Lungs: CTAB, no crackles, no wheeze, with normal respiratory effort CV: RRR, no murmur  Abdomen: Soft, non-tender; non-distended, BS present  Extremities: No peripheral edema, warm Skin: Normal turgor and texture; no rash - ?sclerodactyly, swollen 2nd PIPs L>R Psych: Appropriate affect Neuro: Alert and oriented to person and place, no focal deficit     There were no vitals filed for this visit.       on RA BMI Readings from Last 3 Encounters:  04/25/22 26.40 kg/m  02/08/22 25.97 kg/m  11/21/21 25.80 kg/m   Wt Readings from Last 3 Encounters:  04/25/22 211 lb 3.2 oz (95.8 kg)  02/08/22 207 lb 12.8 oz (94.3 kg)  11/21/21 206 lb 6.4 oz (93.6 kg)     CBC    Component Value Date/Time   WBC 4.6 10/25/2019 0850   RBC 5.23 10/25/2019 0850   HGB 14.8 10/25/2019 0850   HCT 43.7 10/25/2019 0850   PLT 233 10/25/2019 0850   MCV 83.6 10/25/2019 0850   MCH 28.3 10/25/2019 0850   MCHC 33.9 10/25/2019 0850   RDW 12.8 10/25/2019 0850   LYMPHSABS 1.8 10/25/2019 0850   MONOABS 0.3 10/25/2019 0850   EOSABS 0.3 10/25/2019 0850   BASOSABS 0.0 10/25/2019 0850    Chest Imaging: CXR 2018 reviewed by me unremarkable  HRCT chest 10/14/21 reviewed by me unremarkable  Pulmonary Functions Testing Results:    Latest Ref  Rng & Units 11/03/2021    8:32 AM  PFT Results  FVC-Pre L 4.15   FVC-Predicted Pre % 78   FVC-Post L 3.83   FVC-Predicted Post % 72   Pre FEV1/FVC % % 76   Post FEV1/FCV % % 80   FEV1-Pre L 3.15   FEV1-Predicted Pre % 80   FEV1-Post L 3.06    Reviewed by me negative for AHR  Echocardiogram:   TTE 2018: - Left ventricle: The cavity size was normal. Systolic function was  normal. The estimated ejection fraction was in the range of 60%    to 65%. Wall motion was normal; there were no regional wall    motion abnormalities.  - Aortic valve: There was mild regurgitation.  - Mitral valve: There was mild regurgitation.  - Right ventricle: The cavity size was mildly dilated. Wall    thickness was normal.  - Tricuspid valve: There was trivial regurgitation.       Assessment & Plan:   # Episodic, chronic cough  Negative methacholine. Hasn't abated after stopping lisinopril a couple years ago and no response to ppi. Intermittent issues with PND/sinus congestion. 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  Plan: - Take shower, then blow out crusting and then flonase 2 sprays each nostril daily for 2 weeks then 1 spray each nostril daily until next visit - asmanex 1 puff once to twice daily use - continue albuterol as needed - can try to stop prilosec, can use pepcid over the counter and discussed lifestyle measures to reduce GERD below. If your cough worsens after stopping prilosec then you may need to go back on it though. - See you in 6 months or sooner if need be!     Maryjane Hurter, MD [facility] 07/23/2022 3:08 PM

## 2022-07-26 ENCOUNTER — Encounter: Payer: Self-pay | Admitting: Student

## 2022-07-26 ENCOUNTER — Other Ambulatory Visit: Payer: Self-pay | Admitting: Student

## 2022-07-26 ENCOUNTER — Ambulatory Visit (INDEPENDENT_AMBULATORY_CARE_PROVIDER_SITE_OTHER): Payer: No Typology Code available for payment source | Admitting: Student

## 2022-07-26 VITALS — BP 148/90 | HR 93 | Temp 98.1°F | Ht 75.0 in | Wt 204.6 lb

## 2022-07-26 DIAGNOSIS — R053 Chronic cough: Secondary | ICD-10-CM | POA: Diagnosis not present

## 2022-07-26 MED ORDER — BUDESONIDE-FORMOTEROL FUMARATE 160-4.5 MCG/ACT IN AERO: 2.0000 | INHALATION_SPRAY | Freq: Two times a day (BID) | RESPIRATORY_TRACT | 11 refills | Status: DC

## 2022-07-26 NOTE — Patient Instructions (Addendum)
-   See you in 2 months or sooner if need be!

## 2022-07-27 NOTE — Telephone Encounter (Signed)
[facility] not [facility] symbicort  They wat [place] to send Wixela  Please advise, thanks!

## 2022-09-21 NOTE — Progress Notes (Signed)
Synopsis: Referred for chronic cough by Thana Ates, MD  Subjective:   PATIENT ID: Garrett Bailey GENDER: male DOB: 1950-07-18, MRN: [id number]  Chief Complaint  Patient presents with   Follow-up   70yM with history of HTN, HLD referred for chronic cough  Several years of cough. Mostly dry, occasional mucus production. No sinus congestion currently. No frequent sensation of postnasal drainage. Takes prilosec for reflux which he feels controls his reflux - he doesn't think it helped his cough.  Worse if he exercises.  He has tried albuterol in advance of it but it hasn't helped.   He was on lisinopril 20 mg daily, stopped 2-3 years ago.   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 They started trial of ppi  He had breathing tests last year and was told they were normal.   He is on plaquenil unclear reason. Some kind of inflammatory arthritis - sees Dr. Dierdre Forth. No trouble swallowing, rashes, oral/nasal ulcers, raynaud's phenomenon.   Very limited smoking in past. Retired now, worked for post office for about 40 years. Years ago some MJ use but none recent. Was in [place], [place], [place] in past.    Interval HPI:  Cough is the same. In retrospect he is unsure what extent ICS monotherapy truly helped his cough though he knows he didn't tolerate DPI with wixela well. Lots of throat irritation. Some PND but controls it well with intermittent use of flonase.  Otherwise pertinent review of systems is negative.  Past Medical History:  Diagnosis Date   Hyperlipidemia    Hypertension      Family History  Problem Relation Age of Onset   Emphysema Father        smoked     Past Surgical History:  Procedure Laterality Date   CARPAL TUNNEL RELEASE Bilateral    KNEE SURGERY      Social History   Socioeconomic History   Marital status: Married    Spouse name: Not on file   Number of children: Not on file   Years of education: Not on file   Highest  education level: Not on file  Occupational History   Not on file  Tobacco Use   Smoking status: Never   Smokeless tobacco: Never  Vaping Use   Vaping Use: Never used  Substance and Sexual Activity   Alcohol use: Yes    Comment: occ   Drug use: No   Sexual activity: Not on file  Other Topics Concern   Not on file  Social History Narrative   Not on file   Social Determinants of Health   Financial Resource Strain: Not on file  Food Insecurity: Not on file  Transportation Needs: Not on file  Physical Activity: Not on file  Stress: Not on file  Social Connections: Not on file  Intimate Partner Violence: Not on file     Allergies  Allergen Reactions   Cefdinir Diarrhea     Outpatient Medications Prior to Visit  Medication Sig Dispense Refill   albuterol (VENTOLIN HFA) 108 (90 Base) MCG/ACT inhaler Inhale 1 puff into the lungs every 4 (four) hours as needed.     amLODipine (NORVASC) 10 MG tablet Take 10 mg by mouth daily after lunch.      aspirin EC 81 MG tablet Take 81 mg by mouth daily.     Cholecalciferol (VITAMIN D) 2000 units tablet Take 2,000 Units by mouth daily after lunch.  ferrous sulfate 325 (65 FE) MG tablet Take 325 mg by mouth daily with breakfast.     fluticasone (FLONASE) 50 MCG/ACT nasal spray Place 2 sprays into the nose daily as needed for allergies or rhinitis (nasal congestion).      hydroxychloroquine (PLAQUENIL) 200 MG tablet Take 1 tablet by mouth daily.     losartan (COZAAR) 100 MG tablet Take 1 tablet by mouth daily.     omeprazole (PRILOSEC) 40 MG capsule Take 40 mg by mouth daily.     sildenafil (VIAGRA) 100 MG tablet TAKE ONE TABLET BY MOUTH AS INSTRUCTED (TAKE 1 HOUR PRIOR TO SEXUAL ACTIVITY *DO NOT EXCEED 1 DOSE PER 24 HOUR PERIOD*)     simvastatin (ZOCOR) 40 MG tablet Take 20 mg by mouth daily after lunch.      traZODone (DESYREL) 100 MG tablet Take 100 mg by mouth at bedtime as needed for sleep.      WIXELA INHUB 250-50 MCG/ACT AEPB Inhale  1 puff into the lungs in the morning and at bedtime. 1 each 11   No facility-administered medications prior to visit.       Objective:   Physical Exam:  General appearance: 72 y.o., male, NAD, conversant  Eyes: anicteric sclerae; PERRL, tracking appropriately HENT: NCAT; MMM Neck: Trachea midline; no lymphadenopathy, no JVD Lungs: CTAB, no crackles, no wheeze, with normal respiratory effort CV: RRR, no murmur  Abdomen: Soft, non-tender; non-distended, BS present  Extremities: No peripheral edema, warm Skin: Normal turgor and texture; no rash - ?sclerodactyly, swollen 2nd PIPs L>R Psych: Appropriate affect Neuro: Alert and oriented to person and place, no focal deficit     Vitals:   09/25/22 1117  BP: (!) 142/78  Pulse: 75  Temp: 97.6 F (36.4 C)  TempSrc: Oral  SpO2: 98%  Weight: 205 lb 3.2 oz (93.1 kg)  Height: 6\' 3"  (1.905 m)        98% on RA BMI Readings from Last 3 Encounters:  09/25/22 25.65 kg/m  07/26/22 25.57 kg/m  04/25/22 26.40 kg/m   Wt Readings from Last 3 Encounters:  09/25/22 205 lb 3.2 oz (93.1 kg)  07/26/22 204 lb 9.6 oz (92.8 kg)  04/25/22 211 lb 3.2 oz (95.8 kg)     CBC    Component Value Date/Time   WBC 4.6 10/25/2019 0850   RBC 5.23 10/25/2019 0850   HGB 14.8 10/25/2019 0850   HCT 43.7 10/25/2019 0850   PLT 233 10/25/2019 0850   MCV 83.6 10/25/2019 0850   MCH 28.3 10/25/2019 0850   MCHC 33.9 10/25/2019 0850   RDW 12.8 10/25/2019 0850   LYMPHSABS 1.8 10/25/2019 0850   MONOABS 0.3 10/25/2019 0850   EOSABS 0.3 10/25/2019 0850   BASOSABS 0.0 10/25/2019 0850    Chest Imaging: CXR 2018 reviewed by me unremarkable  HRCT chest 10/14/21 reviewed by me unremarkable  Pulmonary Functions Testing Results:    Latest Ref Rng & Units 11/03/2021    8:32 AM  PFT Results  FVC-Pre L 4.15   FVC-Predicted Pre % 78   FVC-Post L 3.83   FVC-Predicted Post % 72   Pre FEV1/FVC % % 76   Post FEV1/FCV % % 80   FEV1-Pre L 3.15    FEV1-Predicted Pre % 80   FEV1-Post L 3.06    Reviewed by me negative for AHR  Echocardiogram:   TTE 2018: - Left ventricle: The cavity size was normal. Systolic function was    normal. The estimated ejection fraction was in  the range of 60%    to 65%. Wall motion was normal; there were no regional wall    motion abnormalities.  - Aortic valve: There was mild regurgitation.  - Mitral valve: There was mild regurgitation.  - Right ventricle: The cavity size was mildly dilated. Wall    thickness was normal.  - Tricuspid valve: There was trivial regurgitation.       Assessment & Plan:   # Episodic, chronic cough  Negative methacholine. Hasn't abated after stopping lisinopril a couple years ago and no response to ppi. Intermittent issues with PND/sinus congestion. 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    He now believes his cough is reliably triggered by exercise. Sometimes he has felt like ICS helpful, sometimes not. Other possibilities to explain his cough could include EIB or EILO - he would rather try to clarify diagnosis than keep attempting trials of LABA/ICS or singulair (which he has not yet tried).   Plan: - CPEX with pre and post exercise spirometry - stop laba-ics - continue albuterol as needed - OK to exercise - I encourage you to exercise  - when pollen season active: - Take shower, then blow out crusting and then flonase 2 sprays each nostril daily for 2 weeks then 1 spray each nostril daily  - consider nonsedating antihistamine like claritin - ok to remain off prilosec, can use pepcid over the counter and discussed lifestyle measures to reduce GERD. If your cough worsens after stopping prilosec then you may need to go back on it though. - See you in 1 month or sooner if need be!     Omar Person,  MD [facility] 09/25/2022 11:24 AM

## 2022-09-25 ENCOUNTER — Encounter: Payer: Self-pay | Admitting: Student

## 2022-09-25 ENCOUNTER — Ambulatory Visit (INDEPENDENT_AMBULATORY_CARE_PROVIDER_SITE_OTHER): Payer: No Typology Code available for payment source | Admitting: Student

## 2022-09-25 VITALS — BP 142/78 | HR 75 | Temp 97.6°F | Ht 75.0 in | Wt 205.2 lb

## 2022-09-25 DIAGNOSIS — R053 Chronic cough: Secondary | ICD-10-CM | POA: Diagnosis not present

## 2022-09-25 NOTE — Patient Instructions (Addendum)
-   you'll be called to schedule exercise test - see you in 4 weeks to discuss - no wixela or any other steroid containing inhaler for 3 weeks before your exercise test

## 2022-10-11 ENCOUNTER — Ambulatory Visit (HOSPITAL_COMMUNITY): Payer: No Typology Code available for payment source | Attending: Internal Medicine

## 2022-10-11 DIAGNOSIS — R053 Chronic cough: Secondary | ICD-10-CM | POA: Diagnosis present

## 2022-10-25 ENCOUNTER — Ambulatory Visit: Payer: POS | Admitting: Student

## 2022-11-06 NOTE — Progress Notes (Unsigned)
Synopsis: Referred for chronic cough by Thana Ates, MD  Subjective:   PATIENT ID: Garrett Bailey GENDER: male DOB: [birth year], MRN: [id number]  No chief complaint on file.  70yM with history of HTN, HLD referred for chronic cough  Several years of cough. Mostly dry, occasional mucus production. No sinus congestion currently. No frequent sensation of postnasal drainage. Takes prilosec for reflux which he feels controls his reflux - he doesn't think it helped his cough.  Worse if he exercises.  He has tried albuterol in advance of it but it hasn't helped.   He was on lisinopril 20 mg daily, stopped 2-3 years ago.   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 They started trial of ppi  He had breathing tests last year and was told they were normal.   He is on plaquenil unclear reason. Some kind of inflammatory arthritis - sees Dr. Dierdre Forth. No trouble swallowing, rashes, oral/nasal ulcers, raynaud's phenomenon.   Very limited smoking in past. Retired now, worked for [employer] for about 40 years. Years ago some MJ use but none recent. Was in [place], [place], [place] in past.    Interval HPI:  Cough is the same. In retrospect he is unsure what extent ICS monotherapy truly helped his cough though he knows he didn't tolerate DPI with wixela well. Lots of throat irritation. Some PND but controls it well with intermittent use of flonase. -------------------- No evidence of EILO or EIB on CPEX but there was mild limitation due to cardiovascular impairment  Otherwise pertinent review of systems is negative.  Past Medical History:  Diagnosis Date   Hyperlipidemia    Hypertension      Family History  Problem Relation Age of Onset   Emphysema Father        smoked     Past Surgical History:  Procedure Laterality Date   CARPAL TUNNEL RELEASE Bilateral    KNEE SURGERY      Social History   Socioeconomic History   Marital status: Married    Spouse name:  Not on file   Number of children: Not on file   Years of education: Not on file   Highest education level: Not on file  Occupational History   Not on file  Tobacco Use   Smoking status: Never   Smokeless tobacco: Never  Vaping Use   Vaping Use: Never used  Substance and Sexual Activity   Alcohol use: Yes    Comment: occ   Drug use: No   Sexual activity: Not on file  Other Topics Concern   Not on file  Social History Narrative   Not on file   Social Determinants of Health   Financial Resource Strain: Not on file  Food Insecurity: Not on file  Transportation Needs: Not on file  Physical Activity: Not on file  Stress: Not on file  Social Connections: Not on file  Intimate Partner Violence: Not on file     Allergies  Allergen Reactions   Cefdinir Diarrhea     Outpatient Medications Prior to Visit  Medication Sig Dispense Refill   albuterol (VENTOLIN HFA) 108 (90 Base) MCG/ACT inhaler Inhale 1 puff into the lungs every 4 (four) hours as needed.     amLODipine (NORVASC) 10 MG tablet Take 10 mg by mouth daily after lunch.      aspirin EC 81 MG tablet Take 81 mg by mouth daily.     Cholecalciferol (VITAMIN D) 2000 units  tablet Take 2,000 Units by mouth daily after lunch.     ferrous sulfate 325 (65 FE) MG tablet Take 325 mg by mouth daily with breakfast.     fluticasone (FLONASE) 50 MCG/ACT nasal spray Place 2 sprays into the nose daily as needed for allergies or rhinitis (nasal congestion).      hydroxychloroquine (PLAQUENIL) 200 MG tablet Take 1 tablet by mouth daily.     losartan (COZAAR) 100 MG tablet Take 1 tablet by mouth daily.     omeprazole (PRILOSEC) 40 MG capsule Take 40 mg by mouth daily.     sildenafil (VIAGRA) 100 MG tablet TAKE ONE TABLET BY MOUTH AS INSTRUCTED (TAKE 1 HOUR PRIOR TO SEXUAL ACTIVITY *DO NOT EXCEED 1 DOSE PER 24 HOUR PERIOD*)     simvastatin (ZOCOR) 40 MG tablet Take 20 mg by mouth daily after lunch.      traZODone (DESYREL) 100 MG tablet Take  100 mg by mouth at bedtime as needed for sleep.      WIXELA INHUB 250-50 MCG/ACT AEPB Inhale 1 puff into the lungs in the morning and at bedtime. 1 each 11   No facility-administered medications prior to visit.       Objective:   Physical Exam:  General appearance: 72 y.o., male, NAD, conversant  Eyes: anicteric sclerae; PERRL, tracking appropriately HENT: NCAT; MMM Neck: Trachea midline; no lymphadenopathy, no JVD Lungs: CTAB, no crackles, no wheeze, with normal respiratory effort CV: RRR, no murmur  Abdomen: Soft, non-tender; non-distended, BS present  Extremities: No peripheral edema, warm Skin: Normal turgor and texture; no rash - ?sclerodactyly, swollen 2nd PIPs L>R Psych: Appropriate affect Neuro: Alert and oriented to person and place, no focal deficit     There were no vitals filed for this visit.         on RA BMI Readings from Last 3 Encounters:  09/25/22 25.65 kg/m  07/26/22 25.57 kg/m  04/25/22 26.40 kg/m   Wt Readings from Last 3 Encounters:  09/25/22 205 lb 3.2 oz (93.1 kg)  07/26/22 204 lb 9.6 oz (92.8 kg)  04/25/22 211 lb 3.2 oz (95.8 kg)     CBC    Component Value Date/Time   WBC 4.6 10/25/2019 0850   RBC 5.23 10/25/2019 0850   HGB 14.8 10/25/2019 0850   HCT 43.7 10/25/2019 0850   PLT 233 10/25/2019 0850   MCV 83.6 10/25/2019 0850   MCH 28.3 10/25/2019 0850   MCHC 33.9 10/25/2019 0850   RDW 12.8 10/25/2019 0850   LYMPHSABS 1.8 10/25/2019 0850   MONOABS 0.3 10/25/2019 0850   EOSABS 0.3 10/25/2019 0850   BASOSABS 0.0 10/25/2019 0850    Chest Imaging: CXR 2018 reviewed by me unremarkable  HRCT chest 10/14/21 reviewed by me unremarkable  CPEX 10/11/22: Exercise testing with gas exchange demonstrates a mild functional impairment when compared to matched sedentary norms. The combination of hypertensive response, severely elevated VE/VCO2 slope, poor O2 pulse (surrogate for stroke volume) response, low DeltaVO2/WR and PETCO2 are all  suggestive of some component of a cardiovascular limitation. There is no evidence of exercise-induced bronchospasm.   Pulmonary Functions Testing Results:    Latest Ref Rng & Units 11/03/2021    8:32 AM  PFT Results  FVC-Pre L 4.15   FVC-Predicted Pre % 78   FVC-Post L 3.83   FVC-Predicted Post % 72   Pre FEV1/FVC % % 76   Post FEV1/FCV % % 80   FEV1-Pre L 3.15   FEV1-Predicted Pre % 80  FEV1-Post L 3.06    Reviewed by me negative for AHR  Echocardiogram:   TTE 2018: - Left ventricle: The cavity size was normal. Systolic function was    normal. The estimated ejection fraction was in the range of 60%    to 65%. Wall motion was normal; there were no regional wall    motion abnormalities.  - Aortic valve: There was mild regurgitation.  - Mitral valve: There was mild regurgitation.  - Right ventricle: The cavity size was mildly dilated. Wall    thickness was normal.  - Tricuspid valve: There was trivial regurgitation.       Assessment & Plan:   # Episodic, chronic cough  Negative methacholine. Hasn't abated after stopping lisinopril a couple years ago and no response to ppi. Intermittent issues with PND/sinus congestion. 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    He now believes his cough is reliably triggered by exercise. Sometimes he has felt like ICS helpful, sometimes not. Other possibilities to explain his cough could include EIB or EILO - he would rather try to clarify diagnosis than keep attempting trials of LABA/ICS or singulair (which he has not yet tried).   Plan: - CPEX with pre and post exercise spirometry - stop laba-ics - continue albuterol as needed - OK to exercise - I encourage you to exercise  - when pollen season active: - Take shower, then blow out crusting and then flonase 2 sprays each  nostril daily for 2 weeks then 1 spray each nostril daily  - consider nonsedating antihistamine like claritin - ok to remain off prilosec, can use pepcid over the counter and discussed lifestyle measures to reduce GERD. If your cough worsens after stopping prilosec then you may need to go back on it though. - See you in 1 month or sooner if need be!     Omar Person, MD [facility] 11/06/2022 3:41 PM

## 2022-11-08 ENCOUNTER — Encounter: Payer: Self-pay | Admitting: Student

## 2022-11-08 ENCOUNTER — Ambulatory Visit (INDEPENDENT_AMBULATORY_CARE_PROVIDER_SITE_OTHER): Payer: 59 | Admitting: Student

## 2022-11-08 VITALS — BP 140/62 | HR 72 | Temp 97.7°F | Ht 75.0 in | Wt 203.0 lb

## 2022-11-08 DIAGNOSIS — I10 Essential (primary) hypertension: Secondary | ICD-10-CM | POA: Diagnosis not present

## 2022-11-08 DIAGNOSIS — R053 Chronic cough: Secondary | ICD-10-CM

## 2022-11-08 MED ORDER — MONTELUKAST SODIUM 10 MG PO TABS: 10.0000 mg | ORAL_TABLET | Freq: Every day | ORAL | 11 refills | Status: DC

## 2022-11-08 NOTE — Patient Instructions (Addendum)
-   There is still possibility of exercise induced bronchoconstriction - your cardiac limitation may have prevented [place] from reaching enough of an exercise challenge to unmask exercise induced bronchoconstriction.  - start montelukast 10 mg daily - if you notice change in mood then stop it - referral made to cardiology

## 2023-01-03 ENCOUNTER — Encounter: Payer: Self-pay | Admitting: Pulmonary Disease

## 2023-01-03 ENCOUNTER — Ambulatory Visit (INDEPENDENT_AMBULATORY_CARE_PROVIDER_SITE_OTHER): Payer: No Typology Code available for payment source | Admitting: Pulmonary Disease

## 2023-01-03 VITALS — BP 142/68 | HR 76 | Temp 97.8°F | Ht 74.5 in | Wt 202.2 lb

## 2023-01-03 DIAGNOSIS — R053 Chronic cough: Secondary | ICD-10-CM

## 2023-01-03 MED ORDER — BUDESONIDE-FORMOTEROL FUMARATE 160-4.5 MCG/ACT IN AERO: 2.0000 | INHALATION_SPRAY | Freq: Two times a day (BID) | RESPIRATORY_TRACT | 3 refills | Status: DC

## 2023-01-03 MED ORDER — FLUTICASONE PROPIONATE 50 MCG/ACT NA SUSP: 2.0000 | Freq: Every day | NASAL | 3 refills | Status: DC

## 2023-01-03 NOTE — Patient Instructions (Signed)
Will stop the Wixela inhaler and started on Symbicort 160/4.5 Continue the Flonase and will call in a prescription You can use chlorpheniramine over-the-counter to help with postnasal drip.  Use 4 mg tablets 3 times daily Will order high-resolution CT for reevaluation of your lungs  Follow-up in 3 to 4 months

## 2023-01-03 NOTE — Progress Notes (Signed)
Garrett Bailey    [id number]    January 21, 1951  Primary Care Physician:Raju, Dorris Singh, MD  Referring Physician: Thana Ates, MD [address] [place],  [place] [address]  Chief complaint: Follow-up for chronic cough  HPI: 72 y.o. who  has a past medical history of Hyperlipidemia and Hypertension.  Here for evaluation of chronic cough.  He was previously evaluated by Dr. Daphane Shepherd with a negative methacholine challenge test.  Lisinopril was held and he was put on PPI with no changes.  He has intermittent posterior nasal drainage, sinus congestion with normal PFTs and HRCT showing no evidence of ILD.  He underwent cardiopulmonary exercise test which indicated possible cardiac limitation.  There is no evidence of exercise-induced bronchospasm.  He subsequently had cardiac evaluation at the [facility] and myocardial perfusion test in August 2024 which did not show any evidence of ischemia  Review of records show that he has been evaluated by Dr. 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  No feather pillows or comforters Smoking history: Never smoker Travel history: No significant travel history Relevant family history: No family history of lung disease  Outpatient Encounter Medications as of 01/03/2023  Medication Sig   albuterol (VENTOLIN HFA) 108 (90 Base) MCG/ACT inhaler Inhale 1 puff into the lungs every 4 (four) hours as needed.   amLODipine (NORVASC) 10 MG tablet Take 10 mg by mouth daily after lunch.    aspirin EC 81 MG tablet Take 81 mg by mouth daily.   Cholecalciferol (VITAMIN D) 2000 units tablet Take 2,000 Units by mouth daily after lunch.   ferrous sulfate 325 (65 FE) MG tablet Take 325 mg by mouth daily with breakfast.   fluticasone (FLONASE) 50 MCG/ACT nasal spray Place 2 sprays into the nose daily as needed for allergies or  rhinitis (nasal congestion).    hydroxychloroquine (PLAQUENIL) 200 MG tablet Take 1 tablet by mouth daily.   losartan (COZAAR) 100 MG tablet Take 1 tablet by mouth daily.   montelukast (SINGULAIR) 10 MG tablet Take 1 tablet (10 mg total) by mouth at bedtime.   omeprazole (PRILOSEC) 40 MG capsule Take 40 mg by mouth daily.   sildenafil (VIAGRA) 100 MG tablet TAKE ONE TABLET BY MOUTH AS INSTRUCTED (TAKE 1 HOUR PRIOR TO SEXUAL ACTIVITY *DO NOT EXCEED 1 DOSE PER 24 HOUR PERIOD*)   simvastatin (ZOCOR) 40 MG tablet Take 20 mg by mouth daily after lunch.    traZODone (DESYREL) 100 MG tablet Take 100 mg by mouth at bedtime as needed for sleep.    WIXELA INHUB 250-50 MCG/ACT AEPB Inhale 1 puff into the lungs in the morning and at bedtime.   No facility-administered encounter medications on file as of 01/03/2023.    Allergies as of 01/03/2023 - Review Complete 01/03/2023  Allergen Reaction Noted   Cefdinir Diarrhea 08/15/2017    Past Medical History:  Diagnosis Date   Hyperlipidemia    Hypertension     Past Surgical History:  Procedure Laterality Date   CARPAL TUNNEL RELEASE Bilateral    KNEE SURGERY      Family History  Problem Relation Age of Onset   Emphysema Father        smoked    Social History   Socioeconomic History   Marital status: Married    Spouse name: Not on file  Number of children: Not on file   Years of education: Not on file   Highest education level: Not on file  Occupational History   Not on file  Tobacco Use   Smoking status: Never   Smokeless tobacco: Never  Vaping Use   Vaping status: Never Used  Substance and Sexual Activity   Alcohol use: Yes    Comment: occ   Drug use: No   Sexual activity: Not on file  Other Topics Concern   Not on file  Social History Narrative   Not on file   Social Determinants of Health   Financial Resource Strain: Not on file  Food Insecurity: Not on file  Transportation Needs: Not on file  Physical Activity: Not  on file  Stress: Not on file  Social Connections: Not on file  Intimate Partner Violence: Not on file    Review of systems: Review of Systems  Constitutional: Negative for fever and chills.  HENT: Negative.   Eyes: Negative for blurred vision.  Respiratory: as per HPI  Cardiovascular: Negative for chest pain and palpitations.  Gastrointestinal: Negative for vomiting, diarrhea, blood per rectum. Genitourinary: Negative for dysuria, urgency, frequency and hematuria.  Musculoskeletal: Negative for myalgias, back pain and joint pain.  Skin: Negative for itching and rash.  Neurological: Negative for dizziness, tremors, focal weakness, seizures and loss of consciousness.  Endo/Heme/Allergies: Negative for environmental allergies.  Psychiatric/Behavioral: Negative for depression, suicidal ideas and hallucinations.  All other systems reviewed and are negative.  Physical Exam: Blood pressure (!) 142/68, pulse 76, temperature 97.8 F (36.6 C), temperature source Oral, height 6' 2.5" (1.892 m), weight 202 lb 3.2 oz (91.7 kg), SpO2 98%. Gen:      No acute distress HEENT:  EOMI, sclera anicteric Neck:     No masses; no thyromegaly Lungs:    Clear to auscultation bilaterally; normal respiratory effort CV:         Regular rate and rhythm; no murmurs Abd:      + bowel sounds; soft, non-tender; no palpable masses, no distension Ext:    No edema; adequate peripheral perfusion Skin:      Warm and dry; no rash Neuro: alert and oriented x 3 Psych: normal mood and affect  Data Reviewed: Imaging: High resolution CT 10/14/2021-no evidence of interstitial lung disease, aortic and coronary atherosclerosis I reviewed the images personally.  PFTs: 11/03/2021 FVC 3.83 [72%], FEV1 2.06 [78%], F/F80 Normal spirometry, negative methacholine challenge test  Labs:  Cardiac: Cardiopulmonary exercise test 10/11/2022 Exercise testing with gas exchange demonstrates a mild functional impairment when compared  to matched sedentary norms. The combination of hypertensive response, severely elevated VE/VCO2 slope, poor O2 pulse (surrogate for stroke volume) response, low DeltaVO2/WR and PETCO2 are all suggestive of some component of a cardiovascular limitation. There is no evidence of exercise-induced bronchospasm.   Myocardial perfusion test December 27, 2022-normal myocardial perfusion, normal LVEF 64%, mildly increased LV diastolic volume, low risk test  Assessment:  Chronic cough He has an extensive workup with negative cardiopulmonary exercise test, normal HRCT, PFTs.  Lisinopril has been held without any improvement and he has not responded to PPI.  He does have some sinus congestion postnasal drip and is currently on Flonase  Previously on Wixela but stopped due to burning sensation in the throat.  Will continue Singulair and try Symbicort For postnasal drip he will continue Flonase and will add chlorpheniramine over-the-counter 3 times daily  History notable for inflammatory arthritis and is on Plaquenil by Dr.  Beekman.  Will repeat HRCT to make sure that he is not developing any interstitial process.  Plan/Recommendations: Stop Wixela, start Symbicort Continue Flonase, start chlorpheniramine High-res CT  Chilton Greathouse MD [facility] 01/03/2023, 10:01 AM  CC: Thana Ates, MD

## 2023-01-19 ENCOUNTER — Ambulatory Visit
Admission: RE | Admit: 2023-01-19 | Discharge: 2023-01-19 | Disposition: A | Discharge status: Home / Self Care | Payer: No Typology Code available for payment source | Source: Ambulatory Visit | Attending: Pulmonary Disease

## 2023-01-19 DIAGNOSIS — R053 Chronic cough: Secondary | ICD-10-CM

## 2023-02-12 IMAGING — CT CT CHEST HIGH RESOLUTION
2 of 7 series · 14 of 36 positions shown, 17 images · non-contrast
Comparison: Chest CT 05/03/2012.

CLINICAL DATA: 70-year-old male with history of chronic cough.
Inflammatory arthritis. Evaluate for interstitial lung disease.



[Series 4: high resolution · axial · 0.71mm/px · z∈[-340,-68]mm · 11 of 328 slices shown, 14 images]
[im 28/328  mediastinal]
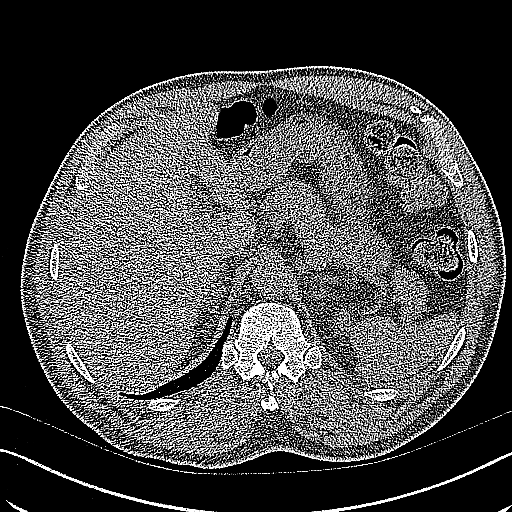
[im 28/328  lung]
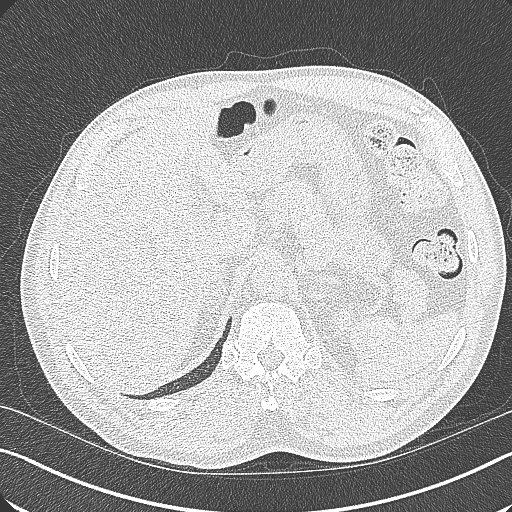
[im 55/328  lung]
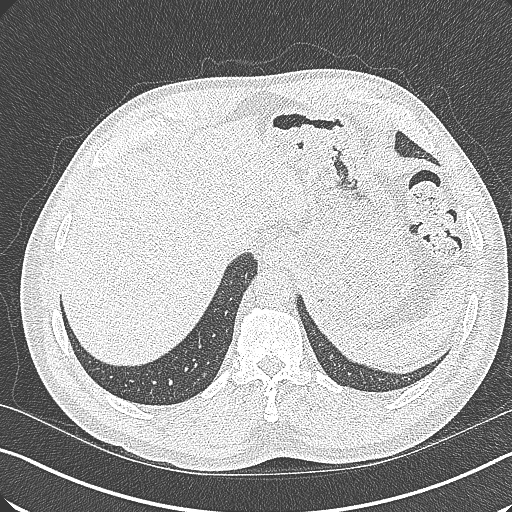
[im 82/328  lung]
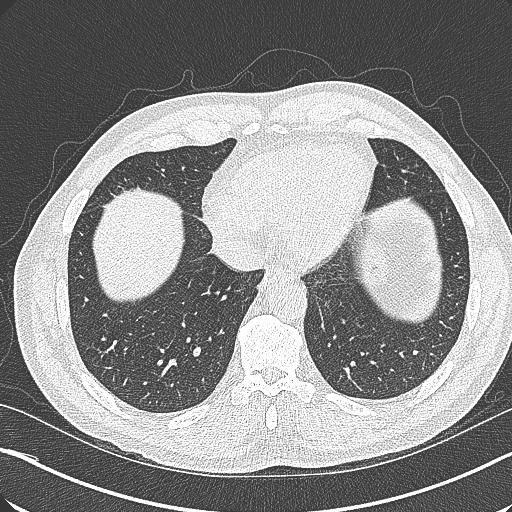
[im 110/328  lung]
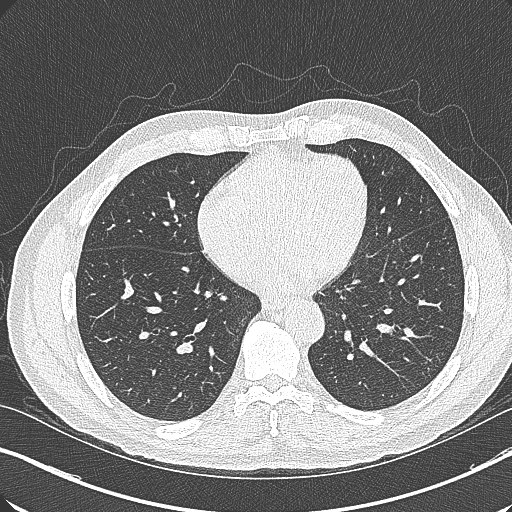
[im 137/328  mediastinal]
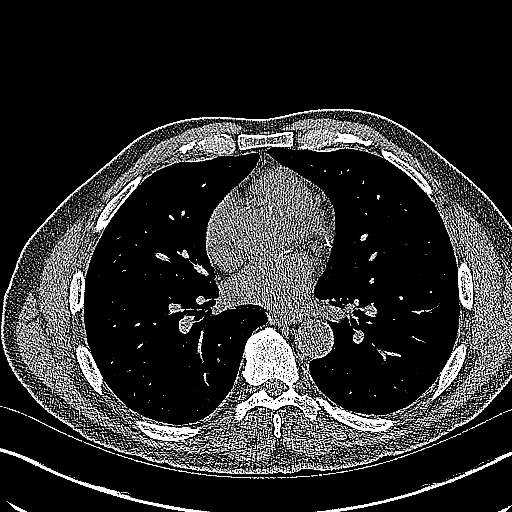
[im 137/328  lung]
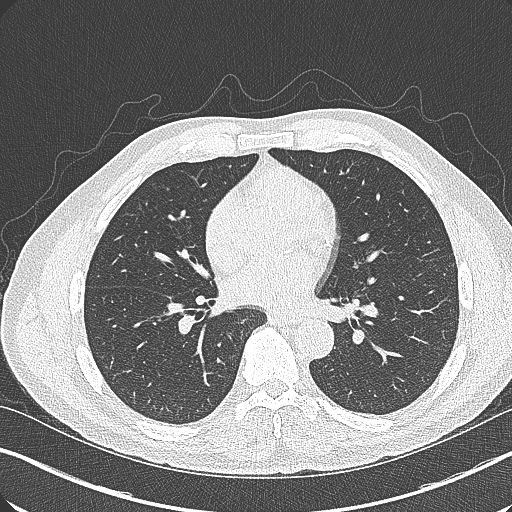
[im 164/328  lung]
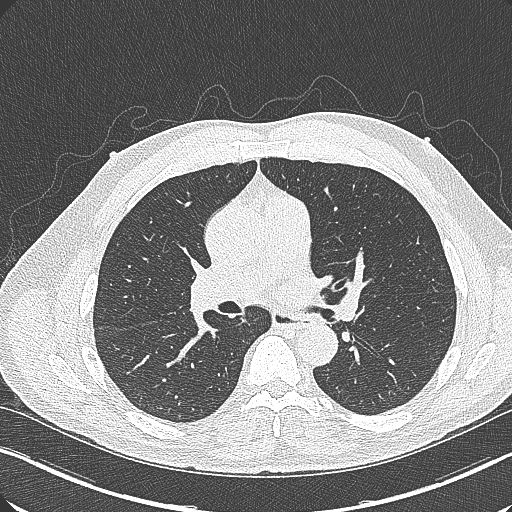
[im 191/328  lung]
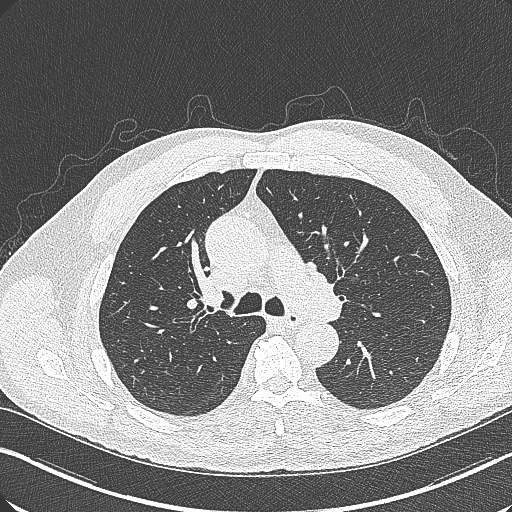
[im 219/328  lung]
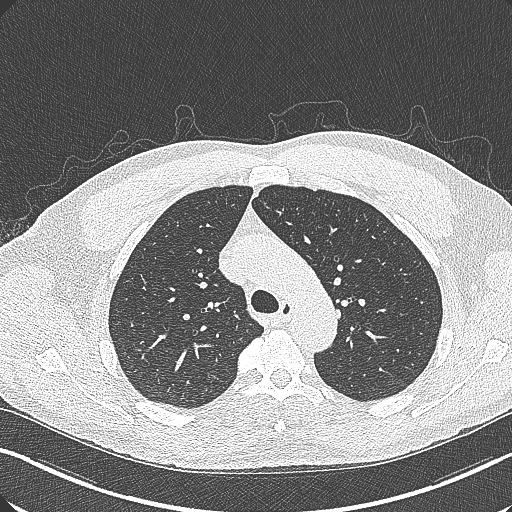
[im 246/328  mediastinal]
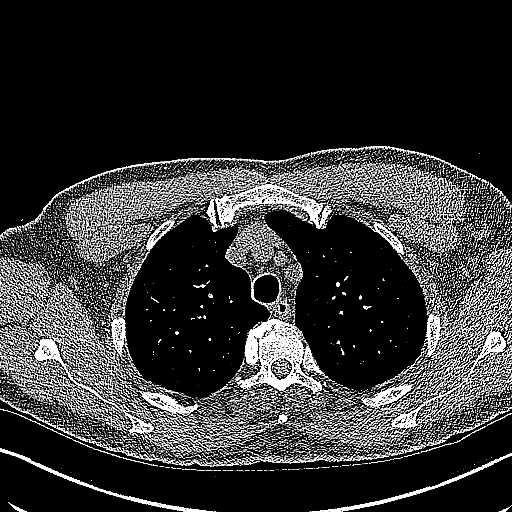
[im 246/328  lung]
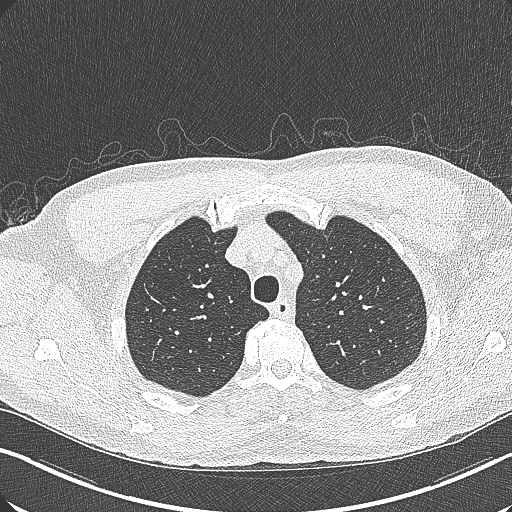
[im 273/328  lung]
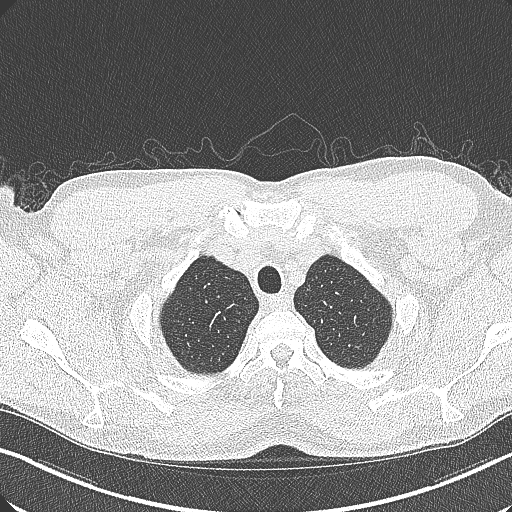
[im 300/328  lung]
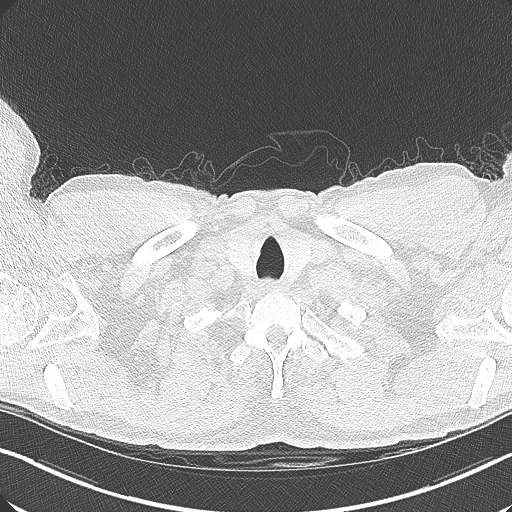

[Series 7: coronal · coronal · 0.63mm/px · 3 of 130 slices shown]
[im 26/130  lung]
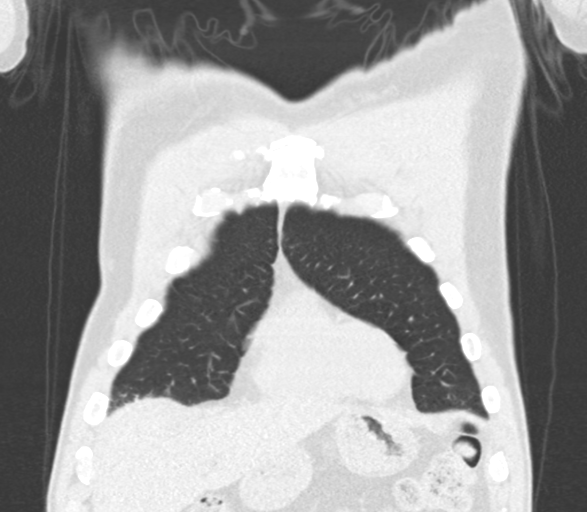
[im 52/130  lung]
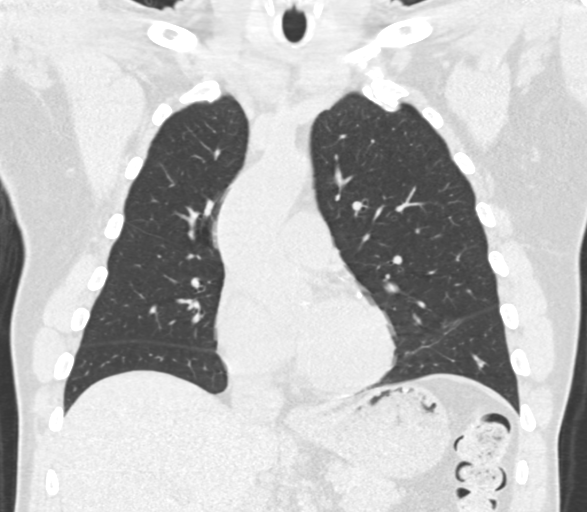
[im 78/130  lung]
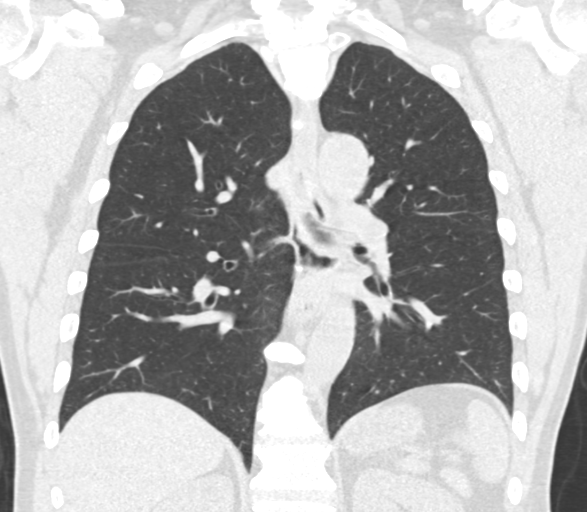

[14 of 36 positions shown; findings below may reference images not displayed]

FINDINGS: Cardiovascular: Heart size is normal. There is no significant
pericardial fluid, thickening or pericardial calcification. There is
aortic atherosclerosis, as well as atherosclerosis of the great
vessels of the mediastinum and the coronary arteries, including
calcified atherosclerotic plaque in the left main, left anterior
descending, left circumflex and right coronary arteries. In
addition, there is ectasia of the ascending thoracic aorta (4.2 cm
in diameter).

Mediastinum/Nodes: No pathologically enlarged mediastinal or hilar
lymph nodes. Please note that accurate exclusion of hilar adenopathy
is limited on noncontrast CT scans. Esophagus is unremarkable in
appearance. No axillary lymphadenopathy.

Lungs/Pleura: High-resolution images demonstrate no significant
regions of ground-glass attenuation, septal thickening, subpleural
reticulation, parenchymal banding, traction bronchiectasis or frank
honeycombing to indicate interstitial lung disease. Inspiratory and
expiratory imaging is unremarkable. No acute consolidative airspace
disease. No pleural effusions. No suspicious appearing pulmonary
nodules or masses are noted.

Upper Abdomen: Aortic atherosclerosis.

Musculoskeletal: There are no aggressive appearing lytic or blastic
lesions noted in the visualized portions of the skeleton.
IMPRESSION: 1. No findings to suggest interstitial lung disease.
2. No acute findings are noted in the thorax to account for the
patient's symptoms.
3. Aortic atherosclerosis, in addition to left main and three-vessel
coronary artery disease. There is also ectasia of the ascending
thoracic aorta (4.2 cm in diameter). Recommend annual imaging
followup by CTA or MRA. This recommendation follows 9949
ACCF/AHA/AATS/ACR/ASA/SCA/NAZARETH/PASS/UGOH/MEADORS Guidelines for the
Diagnosis and Management of Patients with Thoracic Aortic Disease.
Circulation. 9949; 121: [id number]. Aortic aneurysm NOS
(LGMYO-1AC.E).

Aortic Atherosclerosis (LGMYO-BIA.A).

## 2023-06-04 ENCOUNTER — Encounter: Payer: Self-pay | Admitting: Pulmonary Disease

## 2023-06-04 ENCOUNTER — Ambulatory Visit (INDEPENDENT_AMBULATORY_CARE_PROVIDER_SITE_OTHER): Payer: Commercial Managed Care - PPO | Admitting: Pulmonary Disease

## 2023-06-04 VITALS — BP 156/89 | HR 78 | Ht 74.5 in | Wt 206.0 lb

## 2023-06-04 DIAGNOSIS — I7121 Aneurysm of the ascending aorta, without rupture: Secondary | ICD-10-CM | POA: Diagnosis not present

## 2023-06-04 DIAGNOSIS — R053 Chronic cough: Secondary | ICD-10-CM

## 2023-06-04 NOTE — Patient Instructions (Signed)
VISIT SUMMARY:  During today's visit, we discussed your persistent cough, inflammatory arthritis, acid reflux, and aortic aneurysm. Your cough has shown some improvement with your current medications, and we have made some adjustments to help manage it better. We also reviewed your ongoing treatments for arthritis and acid reflux, and discussed the monitoring plan for your aortic aneurysm.  YOUR PLAN:  -CHRONIC COUGH: A chronic cough is a cough that lasts for a long time and can be caused by various factors. Your cough has improved with the use of Symbicort, Singulair, nasal spray, and Prilosec. We recommend continuing with these medications. Additionally, you should use Ventolin before exercising to help prevent coughing during physical activity.  -INFLAMMATORY ARTHRITIS: Inflammatory arthritis is a group of diseases characterized by inflammation of the joints and other tissues. Your condition is being managed by Dr. Dierdre Forth, and there is no lung involvement. Please continue taking Plaquenil as prescribed by your rheumatologist.  -GASTROESOPHAGEAL REFLUX DISEASE (GERD): GERD is a condition where stomach acid frequently flows back into the tube connecting your mouth and stomach, causing irritation. You have occasional flare-ups, and we recommend continuing with Prilosec and using over-the-counter antacids like Tums as needed.  -AORTIC ANEURYSM: An aortic aneurysm is an enlargement of the aorta, the main blood vessel that delivers blood to the body. Your aneurysm is stable at 4.1 cm, and we recommend annual monitoring. A CT scan has been ordered for September 2025 to check its status.  INSTRUCTIONS:  Please follow up in one year for a routine check-up. Ensure you get the CT scan for your aortic aneurysm in September 2025 as scheduled.

## 2023-06-04 NOTE — Progress Notes (Signed)
Garrett Bailey    [id number]    October 24, 1950  Primary Care Physician:Raju, Dorris Singh, MD  Referring Physician: Thana Ates, MD [address] [place],  [place] [address]  Chief complaint: Follow-up for chronic cough  HPI: 73 y.o. who  has a past medical history of Hyperlipidemia and Hypertension.  Here for evaluation of chronic cough.  He was previously evaluated by Dr. Daphane Shepherd with a negative methacholine challenge test.  Lisinopril was held and he was put on PPI with no changes.  He has intermittent posterior nasal drainage, sinus congestion with normal PFTs and HRCT showing no evidence of ILD.  He underwent cardiopulmonary exercise test which indicated possible cardiac limitation.  There is no evidence of exercise-induced bronchospasm.  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  No feather pillows or comforters Smoking history: Never smoker Travel history: No significant travel history Relevant family history: No family history of lung disease  Interim history: Discussed the use of AI scribe software for clinical note transcription with the patient, who gave verbal consent to proceed.  The patient, with a history of inflammatory arthritis, presents with a persistent cough that is exacerbated by exercise. The cough has improved slightly since the last visit, but remains a concern. The patient is currently on Symbicort, an inhaler taken twice daily, which was prescribed during the last visit. He also takes a nasal spray and an antihistamine for allergies.  In addition, the patient has been diagnosed with an aortic aneurysm, which is  slightly larger than normal. He is due for a scan in September of the current year to monitor this condition.  The patient also reports occasional flare-ups of acid reflux, which he manages with Prilosec and over-the-counter medications like Tums. 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   Outpatient Encounter Medications as of 06/04/2023  Medication Sig   albuterol (VENTOLIN HFA) 108 (90 Base) MCG/ACT inhaler Inhale 1 puff into the lungs every 4 (four) hours as needed.   amLODipine (NORVASC) 10 MG tablet Take 10 mg by mouth daily after lunch.    aspirin EC 81 MG tablet Take 81 mg by mouth daily.   budesonide-formoterol (SYMBICORT) 160-4.5 MCG/ACT inhaler Inhale 2 puffs into the lungs 2 (two) times daily.   Cholecalciferol (VITAMIN D) 2000 units tablet Take 2,000 Units by mouth daily after lunch.   ferrous sulfate 325 (65 FE) MG tablet Take 325 mg by mouth daily with breakfast.   fluticasone (FLONASE) 50 MCG/ACT nasal spray Place 2 sprays into the nose daily as needed for allergies or rhinitis (nasal congestion).    fluticasone (FLONASE) 50 MCG/ACT nasal spray Place 2 sprays into both nostrils daily.   hydroxychloroquine (PLAQUENIL) 200 MG tablet Take 1 tablet by mouth daily.   losartan (COZAAR) 100 MG tablet Take 1 tablet by mouth daily.   montelukast (SINGULAIR) 10 MG tablet Take 1 tablet (10 mg total) by mouth at bedtime.   omeprazole (PRILOSEC) 40 MG capsule Take 40 mg by mouth daily.   sildenafil (VIAGRA) 100 MG tablet TAKE ONE TABLET BY MOUTH AS INSTRUCTED (TAKE 1 HOUR PRIOR  TO SEXUAL ACTIVITY *DO NOT EXCEED 1 DOSE PER 24 HOUR PERIOD*)   simvastatin (ZOCOR) 40 MG tablet Take 20 mg by mouth daily after lunch.    traZODone (DESYREL) 100 MG tablet Take 100 mg by mouth at bedtime as needed for sleep.    WIXELA INHUB 250-50 MCG/ACT AEPB Inhale 1 puff into the lungs in the morning and at bedtime. (Patient not taking: Reported on 06/04/2023)   No  facility-administered encounter medications on file as of 06/04/2023.   Physical Exam: Blood pressure (!) 156/89, pulse 78, height 6' 2.5" (1.892 m), weight 206 lb (93.4 kg), SpO2 99%. Gen:      No acute distress HEENT:  EOMI, sclera anicteric Neck:     No masses; no thyromegaly Lungs:    Clear to auscultation bilaterally; normal respiratory effort CV:         Regular rate and rhythm; no murmurs Abd:      + bowel sounds; soft, non-tender; no palpable masses, no distension Ext:    No edema; adequate peripheral perfusion Skin:      Warm and dry; no rash Neuro: alert and oriented x 3 Psych: normal mood and affect   Data Reviewed: Imaging: High resolution CT 10/14/2021-no evidence of interstitial lung disease, aortic and coronary atherosclerosis High resolution CT 01/19/2023-no evidence of interstitial lung disease, 4.1 cm ascending aortic aneurysm. I reviewed the images personally.  PFTs: 11/03/2021 FVC 3.83 [72%], FEV1 2.06 [78%], F/F80 Normal spirometry, negative methacholine challenge test  Labs:  Cardiac: Cardiopulmonary exercise test 10/11/2022 Exercise testing with gas exchange demonstrates a mild functional impairment when compared to matched sedentary norms. The combination of hypertensive response, severely elevated VE/VCO2 slope, poor O2 pulse (surrogate for stroke volume) response, low DeltaVO2/WR and PETCO2 are all suggestive of some component of a cardiovascular limitation. There is no evidence of exercise-induced bronchospasm.   Myocardial perfusion test December 27, 2022-normal myocardial perfusion, normal LVEF 64%, mildly increased LV diastolic volume, low risk test  Assessment:  Chronic cough He has an extensive workup with negative cardiopulmonary exercise test, normal HRCT, PFTs.  Lisinopril has been held without any improvement and he has not responded to PPI.  He does have some sinus congestion postnasal drip and is currently on Flonase  Previously on Wixela but stopped  due to burning sensation in the throat. Improved with Symbicort, Singulair, nasal spray, and Prilosec. CT scan ruled out interstitial lung disease. Cough exacerbated by exercise. -Continue current regimen. -Use Ventolin as pre-exercise prophylaxis.   Inflammatory Arthritis History notable for inflammatory arthritis and is on Plaquenil by Dr. Dierdre Forth. No lung involvement on high-resolution CT. -Continue Plaquenil as prescribed by rheumatologist.  Gastroesophageal Reflux Disease (GERD) Occasional flare-ups. -Continue Prilosec. -Use over-the-counter antacids as needed.  Aortic Aneurysm Stable at 4.1 cm. Annual surveillance recommended. -Order CT scan for September 2025.  Follow-up in 1 year.   Plan/Recommendations: Continue inhalers, Singulair, Prilosec CT follow-up of aortic aneurysm.  Chilton Greathouse MD [facility] 06/04/2023, 9:53 AM  CC: Thana Ates, MD

## 2023-06-11 ENCOUNTER — Ambulatory Visit: Payer: No Typology Code available for payment source | Admitting: Pulmonary Disease

## 2023-06-20 ENCOUNTER — Other Ambulatory Visit: Payer: Self-pay | Admitting: Pulmonary Disease

## 2023-06-20 DIAGNOSIS — R053 Chronic cough: Secondary | ICD-10-CM

## 2023-12-18 ENCOUNTER — Telehealth: Payer: Self-pay

## 2023-12-18 DIAGNOSIS — I7121 Aneurysm of the ascending aorta, without rupture: Secondary | ICD-10-CM

## 2023-12-18 NOTE — Telephone Encounter (Signed)
 Copied from CRM [id number]. Topic: Clinical - Request for Lab/Test Order >> Dec 17, 2023 11:26 AM Celestine FALCON wrote: Reason for CRM: [facility] stating the order for CT Angio Chest W/Cm &/Or Wo Cm (Order [id number]) needs to be updated and changed to ct chest aorta. [facility] phone number is [phone] ext [phone].

## 2023-12-18 NOTE — Telephone Encounter (Signed)
 Copied from CRM [id number]. Topic: Clinical - Request for Lab/Test Order >> Dec 17, 2023 11:26 AM Celestine FALCON wrote: Reason for CRM: [facility] stating the order for CT Angio Chest W/Cm &/Or Wo Cm (Order [id number]) needs to be updated and changed to ct chest aorta. [facility] phone number is [phone].  Spoke with Janella at [facility] [facility]. Advised me that the CT Angio Chest W/Cm &/Or Wo Cm needs to be CT chest aorta . Placed new CT scan in .   Nothing else further needed.

## 2024-01-23 ENCOUNTER — Other Ambulatory Visit

## 2024-01-24 ENCOUNTER — Other Ambulatory Visit: Payer: Self-pay | Admitting: Pulmonary Disease

## 2024-01-24 DIAGNOSIS — R053 Chronic cough: Secondary | ICD-10-CM

## 2024-01-30 ENCOUNTER — Ambulatory Visit
Admission: RE | Admit: 2024-01-30 | Discharge: 2024-01-30 | Disposition: A | Discharge status: Home / Self Care | Source: Ambulatory Visit | Attending: Pulmonary Disease | Admitting: Pulmonary Disease

## 2024-01-30 DIAGNOSIS — I7121 Aneurysm of the ascending aorta, without rupture: Secondary | ICD-10-CM

## 2024-01-30 MED ORDER — IOPAMIDOL (ISOVUE-370) INJECTION 76%
100.0000 mL | Freq: Once | INTRAVENOUS | Status: AC | PRN
  Administered 2024-01-30: 100 mL via INTRAVENOUS

## 2024-02-08 ENCOUNTER — Ambulatory Visit: Payer: Self-pay | Admitting: Pulmonary Disease

## 2024-02-12 MED ORDER — PREDNISONE 10 MG PO TABS: 40.0000 mg | ORAL_TABLET | Freq: Every day | ORAL | 0 refills | Status: AC

## 2024-02-12 NOTE — Telephone Encounter (Signed)
 Prescription for prednisone sent to pharmacy, notified patient. NFN

## 2024-03-19 ENCOUNTER — Ambulatory Visit (INDEPENDENT_AMBULATORY_CARE_PROVIDER_SITE_OTHER): Admitting: Pulmonary Disease

## 2024-03-19 ENCOUNTER — Encounter: Payer: Self-pay | Admitting: Pulmonary Disease

## 2024-03-19 VITALS — BP 130/74 | HR 98 | Temp 98.0°F | Ht 74.0 in | Wt 196.0 lb

## 2024-03-19 DIAGNOSIS — M1380 Other specified arthritis, unspecified site: Secondary | ICD-10-CM

## 2024-03-19 DIAGNOSIS — R053 Chronic cough: Secondary | ICD-10-CM | POA: Diagnosis not present

## 2024-03-19 DIAGNOSIS — R0981 Nasal congestion: Secondary | ICD-10-CM | POA: Diagnosis not present

## 2024-03-19 DIAGNOSIS — J45909 Unspecified asthma, uncomplicated: Secondary | ICD-10-CM

## 2024-03-19 DIAGNOSIS — R0982 Postnasal drip: Secondary | ICD-10-CM

## 2024-03-19 DIAGNOSIS — K219 Gastro-esophageal reflux disease without esophagitis: Secondary | ICD-10-CM

## 2024-03-19 LAB — POCT EXHALED NITRIC OXIDE: FeNO level (ppb): 10

## 2024-03-19 MED ORDER — OMEPRAZOLE 40 MG PO CPDR: 40.0000 mg | DELAYED_RELEASE_CAPSULE | Freq: Two times a day (BID) | ORAL | 3 refills | Status: AC

## 2024-03-19 NOTE — Patient Instructions (Signed)
  VISIT SUMMARY: During your visit, we discussed your chronic cough, which is likely due to a combination of asthma, acid reflux, and sinus congestion. We reviewed your current medications and made some adjustments to better manage your symptoms.  YOUR PLAN: CHRONIC COUGH: Your chronic cough is likely due to asthma, acid reflux, and sinus congestion. -We added Singulair  to your Symbicort  regimen. -We increased your omeprazole to twice daily for one month. -Continue using Flonase  daily. -Consider using over-the-counter allergy medications like Benadryl or Zyrtec for sinus congestion.  ASTHMA: Your asthma is well-controlled with your current Symbicort  regimen. -Continue using Symbicort  twice daily.  GASTROESOPHAGEAL REFLUX DISEASE (GERD): You have ongoing GERD symptoms that may contribute to your chronic cough. -We increased your omeprazole to twice daily for one month.  SINUS CONGESTION AND POSTNASAL DRIP: You have intermittent sinus congestion and postnasal drip, which may contribute to your chronic cough. -Continue using Flonase  daily. -Consider using over-the-counter allergy medications like Benadryl or Zyrtec for sinus congestion.  INFLAMMATORY ARTHRITIS: There is no evidence of interstitial lung disease related to your arthritis on your recent CT scan. -Continue your current management with Plaquenil.

## 2024-03-19 NOTE — Progress Notes (Signed)
 WILEY MAGAN    [id number]    1950-06-08  Primary Care Physician:Raju, Trula SQUIBB, MD  Referring Physician: Dwight Trula SQUIBB, MD [address] [place],  [place] [address]  Chief complaint: Follow-up for chronic cough, asthma  HPI: 73 y.o. who  has a past medical history of Hyperlipidemia and Hypertension.  Here for evaluation of chronic cough.  He was previously evaluated by Dr. Gretel with a negative methacholine  challenge test.  Lisinopril  was held and he was put on PPI with no changes.  He has intermittent posterior nasal drainage, sinus congestion with normal PFTs and HRCT showing no evidence of ILD.  He underwent cardiopulmonary exercise test which indicated possible cardiac limitation.  There is no evidence of exercise-induced bronchospasm.  He subsequently had cardiac evaluation at the [facility] and myocardial perfusion test in August 2024 which did not show any evidence of ischemia  Review of records show that he has been evaluated by Dr. 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  Interim history: Discussed the use of AI scribe software for clinical note transcription with the patient, who gave verbal consent to proceed.  History of Present Illness Garrett Bailey is a 73 year old male with asthma and acid reflux who presents with a chronic cough.  Chronic cough - Chronic cough worsens with physical activity - Prednisone has previously alleviated the cough - Lisinopril  was discontinued due to its potential to cause cough - Recent FENO test performed - CT scan from September 2025 showed no interstitial lung disease  Asthma management - Uses Symbicort  inhaler twice daily for asthma control  Gastroesophageal reflux symptoms - Takes omeprazole once daily for acid reflux - Experiences symptoms of acid reflux - Acknowledges acid reflux may contribute to cough  Upper airway symptoms - Occasional nasal congestion and  post-nasal drip - Manages nasal symptoms with daily Flonase  - Sinus congestion occurs after activities such as yard work - Uses over-the-counter allergy medications (Benadryl or Zyrtec) as needed  Relevant pulmonary history Pets: No pets 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  No feather pillows or comforters Smoking history: Never smoker Travel history: No significant travel history Relevant family history: No family history of lung disease  Outpatient Encounter Medications as of 03/19/2024  Medication Sig   albuterol  (VENTOLIN  HFA) 108 (90 Base) MCG/ACT inhaler Inhale 1 puff into the lungs every 4 (four) hours as needed.   amLODipine  (NORVASC ) 10 MG tablet Take 10 mg by mouth daily after lunch.    aspirin  EC 81 MG tablet Take 81 mg by mouth daily.   budesonide -formoterol  (SYMBICORT ) 160-4.5 MCG/ACT inhaler INHALE 2 PUFFS INTO THE LUNGS TWICE DAILY   Cholecalciferol (VITAMIN D) 2000 units tablet Take 2,000 Units by mouth daily after lunch.   ferrous sulfate 325 (65 FE) MG tablet Take 325 mg by mouth daily with breakfast.   fluticasone  (FLONASE ) 50 MCG/ACT nasal spray Place 2 sprays into the nose daily as needed for allergies or rhinitis (nasal congestion).    fluticasone  (FLONASE ) 50 MCG/ACT nasal spray SHAKE LIQUID AND USE 2 SPRAYS IN EACH NOSTRIL DAILY   hydroxychloroquine (PLAQUENIL) 200 MG tablet Take 1 tablet by mouth daily.   losartan (COZAAR) 100 MG tablet Take 1 tablet by mouth daily.   montelukast  (SINGULAIR ) 10 MG tablet Take 1 tablet (10 mg total) by mouth at bedtime.   omeprazole (PRILOSEC) 40 MG capsule Take 40 mg  by mouth daily.   sildenafil (VIAGRA) 100 MG tablet TAKE ONE TABLET BY MOUTH AS INSTRUCTED (TAKE 1 HOUR PRIOR TO SEXUAL ACTIVITY *DO NOT EXCEED 1 DOSE PER 24 HOUR PERIOD*)   simvastatin  (ZOCOR ) 40 MG tablet Take 20 mg by mouth daily after lunch.    traZODone (DESYREL) 100 MG tablet Take 100 mg by mouth at bedtime as needed  for sleep.    WIXELA INHUB 250-50 MCG/ACT AEPB Inhale 1 puff into the lungs in the morning and at bedtime. (Patient not taking: Reported on 03/19/2024)   No facility-administered encounter medications on file as of 03/19/2024.   Vitals:   03/19/24 0906 03/19/24 0941  BP: (!) 140/70 130/74  Pulse: 98   Temp: 98 F (36.7 C)   Height: 6' 2 (1.88 m)   Weight: 196 lb (88.9 kg)   SpO2: 99%   TempSrc: Oral   BMI (Calculated): 25.15      Physical Exam GEN: No acute distress. CV: Regular rate and rhythm, no murmurs. LUNGS: Clear to auscultation bilaterally, normal respiratory effort. SKIN JOINTS: Warm and dry, no rash.    Data Reviewed: Imaging: High resolution CT 10/14/2021-no evidence of interstitial lung disease, aortic and coronary atherosclerosis High resolution CT 01/19/2023-no evidence of interstitial lung disease, 4.1 cm ascending aortic aneurysm. CTA chest 01/30/2024-no focal consolidation or lung abnormality.  4.0 cm ascending thoracic aortic aneurysm. I reviewed the images personally.  PFTs: 11/03/2021 FVC 3.83 [72%], FEV1 2.06 [78%], F/F80 Normal spirometry, negative methacholine  challenge test  FeNO 03/20/2019 25-10  Labs:  Cardiac: Cardiopulmonary exercise test 10/11/2022 Exercise testing with gas exchange demonstrates a mild functional impairment when compared to matched sedentary norms. The combination of hypertensive response, severely elevated VE/VCO2 slope, poor O2 pulse (surrogate for stroke volume) response, low DeltaVO2/WR and PETCO2 are all suggestive of some component of a cardiovascular limitation. There is no evidence of exercise-induced bronchospasm.   Myocardial perfusion test December 27, 2022-normal myocardial perfusion, normal LVEF 64%, mildly increased LV diastolic volume, low risk test  Assessment & Plan Chronic cough associated with asthma, gastroesophageal reflux disease, and sinus congestion Chronic cough likely multifactorial, related to asthma, GERD,  and sinus congestion. Asthma appears well-controlled with Symbicort . GERD symptoms persist despite current omeprazole regimen. Sinus congestion and postnasal drip may contribute to cough. Previous prednisone use was beneficial, but long-term use is not advisable. FENO test indicates low airway inflammation, suggesting asthma is not a major contributor. CT scan shows no interstitial lung disease. - Added Singulair  to Symbicort  regimen. - Increased omeprazole to twice daily for one month. - Continue using Flonase  daily. - Consider over-the-counter allergy medications like Benadryl or Zyrtec for sinus congestion.  Asthma, well-controlled on Symbicort  Asthma is well-controlled with current Symbicort  regimen. FENO test results indicate low airway inflammation, supporting good asthma control. - Continue Symbicort  twice daily.  Gastroesophageal reflux disease, ongoing symptoms Ongoing GERD symptoms despite current omeprazole regimen. Symptoms may contribute to chronic cough. - Increased omeprazole to twice daily for one month.  Sinus congestion and postnasal drip Intermittent sinus congestion and postnasal drip, which may contribute to chronic cough. Symptoms are not constant but can exacerbate cough when present. - Continue using Flonase  daily. - Consider over-the-counter allergy medications like Benadryl or Zyrtec for sinus congestion.  Inflammatory arthritis No evidence of interstitial lung disease related to arthritis on recent CT scan. - Continue current management with Plaquenil.  Follows with Dr. Mai, rheumatology  Plan/Recommendations: Continue inhalers,Prilosec.  Add Singulair  Increase Prilosec to twice daily Flonase , antihistamine  I personally spent a total of 31 minutes in the care of the patient today including preparing to see the patient, getting/reviewing separately obtained history, performing a medically appropriate exam/evaluation, counseling and educating, independently  interpreting results, and communicating results.   Lonna Coder MD [facility] 03/19/2024, 9:13 AM  CC: Dwight Trula SQUIBB, MD

## 2024-05-22 ENCOUNTER — Ambulatory Visit (INDEPENDENT_AMBULATORY_CARE_PROVIDER_SITE_OTHER): Admitting: Pulmonary Disease

## 2024-05-22 ENCOUNTER — Encounter: Payer: Self-pay | Admitting: Pulmonary Disease

## 2024-05-22 VITALS — BP 195/89 | HR 104 | Temp 98.2°F | Ht 75.0 in | Wt 198.0 lb

## 2024-05-22 DIAGNOSIS — K219 Gastro-esophageal reflux disease without esophagitis: Secondary | ICD-10-CM | POA: Diagnosis not present

## 2024-05-22 DIAGNOSIS — R911 Solitary pulmonary nodule: Secondary | ICD-10-CM

## 2024-05-22 DIAGNOSIS — J4540 Moderate persistent asthma, uncomplicated: Secondary | ICD-10-CM

## 2024-05-22 DIAGNOSIS — J310 Chronic rhinitis: Secondary | ICD-10-CM

## 2024-05-22 DIAGNOSIS — M1380 Other specified arthritis, unspecified site: Secondary | ICD-10-CM

## 2024-05-22 DIAGNOSIS — J45909 Unspecified asthma, uncomplicated: Secondary | ICD-10-CM | POA: Diagnosis not present

## 2024-05-22 DIAGNOSIS — R053 Chronic cough: Secondary | ICD-10-CM

## 2024-05-22 MED ORDER — PREDNISONE 10 MG PO TABS: 20.0000 mg | ORAL_TABLET | Freq: Every day | ORAL | 3 refills | Status: DC

## 2024-05-22 NOTE — Progress Notes (Addendum)
 "              Garrett Bailey    [id number]    Aug 29, 1950  Primary Care Physician:Raju, Trula SQUIBB, MD  Referring Physician: Dwight Trula SQUIBB, MD [address] [place],  [place] [address]  Chief complaint: Follow-up for chronic cough, asthma  HPI: 74 y.o. who  has a past medical history of Hyperlipidemia and Hypertension.  Here for evaluation of chronic cough.  He was previously evaluated by Dr. Gretel with a negative methacholine  challenge test.  Lisinopril  was held and he was put on PPI with no changes.  He has intermittent posterior nasal drainage, sinus congestion with normal PFTs and HRCT showing no evidence of ILD.  He underwent cardiopulmonary exercise test which indicated possible cardiac limitation.  There is no evidence of exercise-induced bronchospasm.  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  Interim history: Discussed the use of AI scribe software for clinical note transcription with the patient, who gave verbal consent to proceed.  History of Present Illness  Garrett Bailey is a 74 year old male with chronic cough and asthma who presents with persistent coughing and concerns about a lung spot. He is accompanied by his wife, Naomie. He was referred by a previous doctor for evaluation of a possible spot on his lung.  Chronic cough - Persistent, constant cough for an extended period - Worsening of cough after returning from a cruise on April 21, 2024 - Cough has not resolved with current treatments - Cough significantly limits ability to exercise, go to the gym, and work in the yard - Cough disrupts sleep and affects social interactions and travel - Frequent mucus production and throat sensation after exercise - No recent fever or  flu-like symptoms  Asthma and allergic symptoms - Asthma managed with daily Symbicort  and Singulair  - Uses Flonase  and intermittent Benadryl for post-nasal drip - Prednisone  in September 2025 provided temporary improvement in cough - Despite current regimen, cough persists  Pulmonary imaging findings - Chest x-ray on May 12, 2024 performed in the ED showed a possible spot on the lung - Advised to follow up for further evaluation of lung findings  Relevant pulmonary history Pets: No pets 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  No feather pillows or comforters Smoking history: Never smoker Travel history: No significant travel history Relevant family history: No family history of lung disease  Outpatient Encounter Medications as of 05/22/2024  Medication Sig   albuterol  (VENTOLIN  HFA) 108 (90 Base) MCG/ACT inhaler Inhale 1 puff into the lungs every 4 (four) hours as needed.   amLODipine  (NORVASC ) 10 MG tablet Take 10 mg by mouth daily after lunch.    aspirin  EC 81 MG tablet Take 81 mg by mouth daily.   budesonide -formoterol  (SYMBICORT ) 160-4.5 MCG/ACT inhaler INHALE 2 PUFFS INTO THE LUNGS TWICE DAILY   Cholecalciferol (VITAMIN D) 2000 units tablet Take 2,000 Units by mouth daily after lunch.   ferrous sulfate 325 (65 FE) MG tablet Take 325 mg by mouth daily with breakfast.   fluticasone  (FLONASE ) 50 MCG/ACT nasal spray Place 2 sprays into the nose daily as needed for allergies or rhinitis (nasal congestion).    fluticasone  (FLONASE ) 50 MCG/ACT nasal spray SHAKE LIQUID  AND USE 2 SPRAYS IN EACH NOSTRIL DAILY   hydroxychloroquine (PLAQUENIL) 200 MG tablet Take 1 tablet by mouth daily.   losartan (COZAAR) 100 MG tablet Take 1 tablet by mouth daily.   montelukast  (SINGULAIR ) 10 MG tablet Take 1 tablet (10 mg total) by mouth at bedtime.   omeprazole  (PRILOSEC) 40 MG capsule Take 1 capsule (40 mg total) by mouth 2 (two) times daily.   sildenafil  (VIAGRA) 100 MG tablet TAKE ONE TABLET BY MOUTH AS INSTRUCTED (TAKE 1 HOUR PRIOR TO SEXUAL ACTIVITY *DO NOT EXCEED 1 DOSE PER 24 HOUR PERIOD*)   simvastatin  (ZOCOR ) 40 MG tablet Take 20 mg by mouth daily after lunch.    traZODone (DESYREL) 100 MG tablet Take 100 mg by mouth at bedtime as needed for sleep.    WIXELA INHUB 250-50 MCG/ACT AEPB Inhale 1 puff into the lungs in the morning and at bedtime. (Patient not taking: Reported on 03/19/2024)   No facility-administered encounter medications on file as of 05/22/2024.      Physical Exam GEN: No acute distress. CV: Regular rate and rhythm, no murmurs. LUNGS: Lungs not clear to auscultation, normal respiratory effort. SKIN JOINTS: Warm and dry, no rash.    Data Reviewed: Imaging: High resolution CT 10/14/2021-no evidence of interstitial lung disease, aortic and coronary atherosclerosis High resolution CT 01/19/2023-no evidence of interstitial lung disease, 4.1 cm ascending aortic aneurysm. CTA chest 01/30/2024-no focal consolidation or lung abnormality.  4.0 cm ascending thoracic aortic aneurysm.  I reviewed the images personally.  PFTs: 11/03/2021 FVC 3.83 [72%], FEV1 2.06 [78%], F/F80 Normal spirometry, negative methacholine  challenge test  FeNO 03/20/2019 25-10  Labs:  Cardiac: Cardiopulmonary exercise test 10/11/2022 Exercise testing with gas exchange demonstrates a mild functional impairment when compared to matched sedentary norms. The combination of hypertensive response, severely elevated VE/VCO2 slope, poor O2 pulse (surrogate for stroke volume) response, low DeltaVO2/WR and PETCO2 are all suggestive of some component of a cardiovascular limitation. There is no evidence of exercise-induced bronchospasm.   Myocardial perfusion test December 27, 2022-normal myocardial perfusion, normal LVEF 64%, mildly increased LV diastolic volume, low risk test  Assessment & Plan Chronic cough Chronic cough likely multifactorial, related to asthma,  GERD, and sinus congestion.  Persisting since returning from a cruise on December 8th, exacerbated by physical activity and not relieved by previous treatments including Symbicort , Singulair , and Prilosec. Prednisone  is the only treatment that provides temporary relief. Differential includes sinus congestion, post-nasal drip, and reflux. Concerns about quality of life due to persistent cough. - Ordered CT scan of the chest to evaluate pulmonary nodule and assess lung condition. - Checked inflammatory markers to assess for potential biologic asthma injection therapy. - Referred to ENT at [facility] for evaluation of throat and vocal cords. - Prescribed prednisone  20 mg for long-term use, with discussion of potential side effects including weight gain, ulcers, bone weakening, and immune suppression. - Recommended chlorpheniramine 4-8 mg three times a day for post-nasal drip.  Asthma Managed with Symbicort  and Singulair .  - Continue Symbicort  inhaler. - Ensure daily use of Singulair  (montelukast ). - Prednisone  as above - Assess if he is a candidate for biologic therapy  Gastroesophageal reflux disease Previously managed with Prilosec, increased to 20 mg twice daily. The patient did not experience significant or sustained improvement with this therapy. - Continue Prilosec 20 mg twice daily.  Pulmonary nodule, under evaluation Pulmonary nodule identified on chest x-ray recently, with concern for potential malignancy. Previous CT scan in September showed no abnormalities. Further  evaluation needed to rule out nodule development. - Ordered CT scan of the chest to evaluate pulmonary nodule.  Chronic rhinitis Managed with Flonase  and antihistamines. Current symptoms include post-nasal drip and mucus production. - Continue Flonase . - Recommended chlorpheniramine 4-8 mg three times a day for post-nasal drip.  Inflammatory arthritis No evidence of interstitial lung disease related to arthritis on  recent CT scan. - Continue current management with Plaquenil.  Follows with Dr. Mai, rheumatology  Plan/Recommendations: Continue Singulair , Symbicort , PPI, Flonase  Start chlorpheniramine Check CBC with differential, IgE Prednisone  20 mg daily ENT referral CT chest  I personally spent a total of 40 minutes in the care of the patient today including preparing to see the patient, getting/reviewing separately obtained history, placing orders, referring and communicating with other health care professionals, documenting clinical information in the EHR, independently interpreting results, communicating results, and coordinating care.   Tariana Moldovan MD [facility] 05/22/2024, 3:02 PM  CC: Dwight Trula SQUIBB, MD    "

## 2024-05-22 NOTE — Patient Instructions (Signed)
" °  VISIT SUMMARY: During your visit, we discussed your persistent cough, asthma, and concerns about a possible lung spot. We reviewed your symptoms, current treatments, and recent imaging results. We have made some adjustments to your medications and ordered further tests to better understand your condition.  YOUR PLAN: CHRONIC COUGH: You have had a persistent cough since returning from a cruise, which has not improved with your current treatments. -We have ordered a CT scan of your chest to evaluate the spot on your lung and assess your overall lung condition. -We checked your inflammatory markers to see if injection therapy might be needed. -You are referred to an ENT specialist to evaluate your throat and vocal cords. -We prescribed prednisone  20 mg for long-term use. Be aware of potential side effects like weight gain, ulcers, bone weakening, and immune suppression. -Take over-the-counter chlorpheniramine 4-8 mg three times a day for post-nasal drip.  ASTHMA: Your asthma is currently managed with Symbicort  and Singulair , but your chronic cough may be causing an exacerbation. -Continue using your Symbicort  inhaler as prescribed. -Make sure to take Singulair  (montelukast ) daily.  GASTROESOPHAGEAL REFLUX DISEASE (GERD): Your reflux disease was previously managed with Prilosec, but you did not experience significant improvement. -Continue taking Prilosec daily.  PULMONARY NODULE: A spot was identified on your lung, and we need to evaluate it further to rule out any serious conditions. -We have ordered a CT scan of your chest to evaluate the pulmonary nodule.  CHRONIC RHINITIS: You have symptoms of post-nasal drip and mucus production, managed with Flonase  and antihistamines. -Continue using Flonase  as prescribed. -Take over-the-counter chlorpheniramine 4-8 mg three times a day for post-nasal drip.                            Contains text generated by [employer].   "

## 2024-05-23 LAB — CBC WITH DIFFERENTIAL/PLATELET
Basophils Absolute: 0 K/uL (ref 0.0–0.1)
Basophils Relative: 1.1 % (ref 0.0–3.0)
Eosinophils Absolute: 0.1 K/uL (ref 0.0–0.7)
Eosinophils Relative: 1.7 % (ref 0.0–5.0)
HCT: 40.3 % (ref 39.0–52.0)
Hemoglobin: 13.3 g/dL (ref 13.0–17.0)
Lymphocytes Relative: 26.3 % (ref 12.0–46.0)
Lymphs Abs: 1.2 K/uL (ref 0.7–4.0)
MCHC: 32.9 g/dL (ref 30.0–36.0)
MCV: 82.2 fl (ref 78.0–100.0)
Monocytes Absolute: 0.4 K/uL (ref 0.1–1.0)
Monocytes Relative: 9.3 % (ref 3.0–12.0)
Neutro Abs: 2.9 K/uL (ref 1.4–7.7)
Neutrophils Relative %: 61.6 % (ref 43.0–77.0)
Platelets: 242 K/uL (ref 150.0–400.0)
RBC: 4.9 Mil/uL (ref 4.22–5.81)
RDW: 14.4 % (ref 11.5–15.5)
WBC: 4.7 K/uL (ref 4.0–10.5)

## 2024-05-26 ENCOUNTER — Inpatient Hospital Stay: Admission: RE | Admit: 2024-05-26 | Discharge: 2024-05-26 | Attending: Pulmonary Disease

## 2024-05-26 DIAGNOSIS — R053 Chronic cough: Secondary | ICD-10-CM

## 2024-05-27 ENCOUNTER — Ambulatory Visit: Payer: Self-pay | Admitting: Pulmonary Disease

## 2024-05-27 LAB — IGE: IgE (Immunoglobulin E), Serum: 3 kU/L

## 2024-06-05 ENCOUNTER — Telehealth: Payer: Self-pay

## 2024-06-05 DIAGNOSIS — R053 Chronic cough: Secondary | ICD-10-CM

## 2024-06-05 MED ORDER — PREDNISONE 10 MG PO TABS: 20.0000 mg | ORAL_TABLET | Freq: Every day | ORAL | 3 refills | Status: AC

## 2024-06-05 MED ORDER — FLUTICASONE PROPIONATE 50 MCG/ACT NA SUSP: 2.0000 | Freq: Every day | NASAL | 3 refills | Status: AC | PRN

## 2024-06-05 MED ORDER — BUDESONIDE-FORMOTEROL FUMARATE 160-4.5 MCG/ACT IN AERO: 2.0000 | INHALATION_SPRAY | Freq: Two times a day (BID) | RESPIRATORY_TRACT | 11 refills | Status: AC

## 2024-06-05 MED ORDER — MONTELUKAST SODIUM 10 MG PO TABS: 10.0000 mg | ORAL_TABLET | Freq: Every day | ORAL | 11 refills | Status: AC

## 2024-06-05 NOTE — Telephone Encounter (Signed)
 Copied from CRM [id number]. Topic: Clinical - Medication Question >> Jun 04, 2024  4:48 PM Garrett Bailey wrote: Reason for CRM: Patient would like a call from nurse regarding having his current medications sent to the [facility] instead of [facility].  Patient needs refill on Montelukast , prednisone , Symbicort  and Flonase . Sent to [facility] pharmacy  Above Rx refilled. NFN

## 2024-08-25 ENCOUNTER — Ambulatory Visit: Admitting: Pulmonary Disease
